# Patient Record
Sex: Female | Born: 1940 | Race: White | Hispanic: No | Marital: Married | Smoking: Never smoker
Health system: Southern US, Community
[De-identification: ages and names within clinical notes are randomized; demographics above are authoritative.]

---

## 2006-08-12 ENCOUNTER — Ambulatory Visit: Payer: Self-pay | Admitting: Internal Medicine

## 2006-08-12 LAB — CONVERTED CEMR LAB
BUN: 11 mg/dL (ref 6–23)
Basophils Absolute: 0 10*3/uL (ref 0.0–0.1)
Chloride: 102 meq/L (ref 96–112)
Creatinine, Ser: 0.8 mg/dL (ref 0.4–1.2)
Eosinophils Absolute: 0.2 10*3/uL (ref 0.0–0.6)
GFR calc non Af Amer: 76 mL/min
MCHC: 34.7 g/dL (ref 30.0–36.0)
Monocytes Absolute: 0.7 10*3/uL (ref 0.2–0.7)
Monocytes Relative: 13 % — ABNORMAL HIGH (ref 3.0–11.0)
Potassium: 4.4 meq/L (ref 3.5–5.1)
Pro B Natriuretic peptide (BNP): 58 pg/mL (ref 0.0–100.0)
RBC: 3.76 M/uL — ABNORMAL LOW (ref 3.87–5.11)
RDW: 14.1 % (ref 11.5–14.6)
Sed Rate: 25 mm/hr (ref 0–25)

## 2006-08-21 ENCOUNTER — Ambulatory Visit: Payer: Self-pay | Admitting: Cardiology

## 2006-09-02 ENCOUNTER — Ambulatory Visit: Payer: Self-pay

## 2010-07-10 NOTE — Assessment & Plan Note (Signed)
[facility]                             PULMONARY OFFICE NOTE   NAME:Cynthia Benson, Cynthia Benson                          MRN:          [id number]  DATE:08/12/2006                            DOB:          1940-08-03    HISTORY:  A 70 year old white female with 2 episodes of being diagnosed  with pneumonia, both starting first with cough to the point of vomiting,  associated with bilateral infiltrates one in April and the other time in  May.  She required 2 separate hospitalizations and has been left with  persistent dyspnea with exertion and 2 pillow orthopnea, but  interestingly no longer has any cough and denies any difficulty  swallowing.  She is seen now at Dr. Donita Brooks request because of an  abnormal chest CT scan which was striking mostly for the abnormality of  the esophagus (see below) and already has followup per GI arranged.   The patient denies ever having purulent sputum or fever during her 2  episodes of clinical pneumonia.  She remembers vomiting before each one.  She denies any dysphagia now, ongoing fevers and chills, and denies any  pleuritic pain at any time or exertional chest pain.   PAST MEDICAL HISTORY:  Presently she is too short of breath to do all of  her housework, whereas, prior to these illnesses she could do pretty  much anything she wanted.  1. Heart failure and hypertension. (I could not find a reference to      an echocardiogram in our records however)  2. Remote thyroid surgery.  3. Back surgery.  4. Stomach surgery, she is status post hysterectomy and      cholecystectomy.   ALLERGIES:  NONE KNOWN.   MEDICATIONS:  No pulmonary medicines but she is on both Actonel and  Tums.   For full inventory, please see face sheet column dated August 12, 2006.  Her medications were confusing to me.  I think she is still on  lisinopril, since it is listed on her discharge summary although she did  not list it today.  She also takes Nexium  but typically after lunch.   SOCIAL HISTORY:  She has never smoked, she is retired with no unusual  travel, pet, or hobby exposure.   FAMILY HISTORY:  Positive for asthma in her mother, cancer of the breast  in her sister.   REVIEW OF SYSTEMS:  Taken in detail on the worksheet and negative except  as outlined above.   PHYSICAL EXAMINATION:  This is a pleasant, ambulatory white female in no  acute distress. She is afebrile, normal vital signs.  She is quite  hoarse, In fact she becomes more hoarse the more she talks.  HEENT:  Unremarkable, oropharynx clear.  NECK:  Supple without cervical adenopathy, tenderness, trachea is  midline, no thyromegaly.  LUNGS:  Fields are completely clear bilaterally to auscultation and  percussion.  Regular rhythm without murmur, gallop, rub.  ABDOMEN:  Soft, benign.  EXTREMITIES:  Warm without calf tenderness, cyanosis, clubbing, edema.   Chest x-ray is pending.   CT scan  of the chest was reviewed from Jul 23, 2006 and indicates a  diffusely dilated, fluid-filled esophagus with prominent hiatal hernia  and patchy infiltrates primarily in the right lung with minimal  adenopathy.  Swallowing study was performed on July 27, 2006 indicating  motility within normal limits and no significant abnormality.  There was  mild prominence of the cricopharyngeal muscle.   No mention of reflux.   We ambulated the patient around the hall for more than 300 feet (she  previously told me she could only walk across the room and she had  shortness of breath after the second lap which was at 320 feet.  Was  saturating consistently above 88 until the very lest few feet which she  said is way beyond what I usually do.   IMPRESSION:  This patient has had 2 episodes of clinical pulmonary  infiltrates associated with vomiting in the setting of having abnormal  esophagus noted on CT scan, but appears to function normally on barium  swallow.  This leads me believe she has  a functional rather than  structural upper airway problem that is probably related to the  combination of low grade reflux and angiotensin converting enzyme  inhibitors.  I have found the combination of these 2 markedly  destabilized the upper airway, to the point where patients frequently  tell me that they coughed to the point where they gag and vomit.  Although this association is not clear from the literature, it has been  my personal experience that the 2 can be very destabilizing.   The first issue therefore is to maximize treatment directed at the upper  airway and minimize any medication that might contribute to the problem.  For that reason I recommended she take no more angiotensin converting  enzyme inhibitor and if after, a reduction is needed, I would endorse  ARB over calcium channel blockers because they could potentially  interfere with esophageal function.  I will also stop Actonel and  calcium in the short run and then add them back if we can establish a  firm baseline of improved dyspnea, elimination of current cough, and  gagging and vomiting.   I did recommend the combination of a sed rate and BNP today.  I have  found these to be very helpful although neither one is specific, they  both tend toward or away from primary lung disease and toward component  of congestive heart failure in patients with pulmonary infiltrative  disorder.   The patient already an appointment to see Dr. Daleen Squibb and her  gastrointestinal physician.  If her infiltrates remain despite optimal  management of her cardiac issues and gastrointestinal issues I would be  certainly happy to see her back here on a p.r.n. basis.   PLAN:  Would add back Actonel and Calcium tablets once we have  established optimal control of reflux and elimination of both dyspnea  and cough, hopefully back to her normal state by the above  recommendations.  Reviewed with her a gastroesophageal reflux disease diet  including the importance of taking Nexium 30-60 minutes before  breakfast time on a perfectly regular basis until we have a chance to  establish whether a reflux, even a small amount, has anything to do with  her pulmonary complaints.     Cynthia Dalton. Sherene Sires, MD, [facility]  Electronically Signed    MBW/MedQ  DD: 08/12/2006  DT: 08/13/2006  Job #: [id number]   cc:   Philemon Kingdom

## 2010-07-10 NOTE — Assessment & Plan Note (Signed)
HEALTHCARE                            CARDIOLOGY OFFICE NOTE   NAME:Cynthia Benson                        MRN:          [id number]  DATE:08/21/2006                            DOB:          April 28, 1940    Cynthia Benson is a 70 year old white female who comes for  evaluation of attenuate heart failure.   She has been admitted to [facility] twice over the past  several months. Both times she has had aspiration pneumonia, which in  looking through the records, looks like it was complicated with near-  respiratory failure, loss of consciousness or ability to get up after a  fall, rapid myolysis, and a question of congestive heart failure. During  her last hospitalization, her BNP was 1900.   Much of the etiology for recurrent aspiration pneumonia seems to be a  patulous esophagus with a very prominent hiatal hernia. CT scan has  showed a diffusely dilated esophagus with fluid. She saw Dr. Sandrea Hughs of our pulmonary division last week for this dilemma.   Her CT scan also showed no pulmonary emboli at the time.   Apparently, she had an echocardiogram during her stay, but I have no  report of such. I cannot find it in the discharge summary as well.   Her CPK enzymes were very high peaking at 5754 with an NB of 35.1. This  was felt not to be consistent with a cardiac source. Her troponin was  0.10.   She was treated with antibiotics and recovered. She was discharged home  on 07/29/06 for the second hospitalization.   During her hospitalization, she apparently had a tachycardia and there  was a question of a supraventricular tachycardia listed in the discharge  diagnosis. I have no telemetry strips. She was placed on Digoxin, as  well as Coreg. Dr. Sherene Sires stopped her Digoxin last week.   PAST MEDICAL HISTORY:  She has chronic peptic ulcer disease. She has had  a recent GI bleed in the past with anemia. She has a history of  hypothyroidism, chronic back pain, and a chronic pain syndrome. She has  a history of osteoarthritis, restless legs syndrome, and depression.  There is a labeling of peripheral vascular disease, but I have no  specific details.   PAST SURGICAL HISTORY:  She has had a sub-total gastrectomy from chronic  bleeding ulcers. She has had a cholecystectomy, appendectomy,  hysterectomy, and a bilateral salpingo oophorectomy. She has had lumbar  laminectomies x2, left knee reconstruction with vein stripping and  bladder tacking, as well as thyroidectomy secondary to Graves disease.   ALLERGIES:  No known drug allergies.   CURRENT MEDICATIONS:  1. Furosemide 20 mg daily.  2. Nexium 40 mg once daily.  3. Levothyroxine 100 mcg daily.  4. Temazepam 30 mg p.r.n.  5. Ferralet 90 mg daily.  6. Carvedilol 3.125 mg b.i.d.  7. Folic acid 1 mg daily.  8. Requip 2 mg daily.  9. Avinza 60 mg daily.  10.Endocet 10/325 mg p.r.n.  11.Lyrica 75 mg b.i.d.   She sleeps with two bricks  under her bed and also with two pillows.   SOCIAL HISTORY:  She is a non-smoker. She does not drink. She is exposed  to a lot of secondary smoke from her husband. She lives at home with her  husband in [place].   FAMILY HISTORY:  Remarkable for hypertension, diabetes, and a cerebral  vascular accident, but no premature coronary disease.   REVIEW OF SYSTEMS:  She denies any true orthopnea, PND, or peripheral  edema. She has had no PND.   The rest of her review of systems are in the HPI, otherwise negative.   PHYSICAL EXAMINATION:  GENERAL:  She is very pleasant.  VITAL SIGNS:  Blood pressure 105/57, pulse 58 and regular. Her  electrocardiogram shows sinus brady with poor R-wave progression across  the anterior precordium with possible septal infarct. She weighs 131  pounds. She is 5 foot 7.5 inches.  HEENT:  Normocephalic atraumatic. PERRLA, extraocular movements intact,  sclerae slightly injected. Facial symmetry is  normal.  NECK:  Supple. Carotid upstrokes are equal bilaterally without bruits.  There is no JVD. Thyroid is not enlarged. Trachea is midline.  LUNGS:  Reveal some crackles in both bases, but no rales. There is no  rhonchi.  HEART:  Reveals a non-displaced PMI. She has a normal S1, S2 with no  murmurs, rubs, or gallops.  ABDOMEN:  Soft with good bowel sounds. She has tenderness over the sub-  xyphoid process from a echocardiogram at [facility]. She says that they  pressed real hard. There is no hepatomegaly. There is no tenderness.  There is no midline bruit.  EXTREMITIES:  Reveal no cyanosis, clubbing, or edema. Pulses are  present.  NEUROLOGIC:  Intact.  SKIN:  Shows a few ecchymoses.   ASSESSMENT:  1. History of aspiration pneumonia, status post 2 hospitalizations in      the past several months. This was felt to be due to a dilated      esophagus with achalasia and a large hiatal hernia. She is doing      appropriate measures at present to lessen this problem. She saw Dr.      Sherene Sires about this last week.  2. Elevated BNP of unknown significance. I am not sure if she has      clinical heart failure.  3. Other problems as listed above.   PLAN:  1. Obtain 2D echo report from [facility].  2. With her dyspnea on exertion and cardiac risk factors, we will      perform an adenosine Myoview. If this is negative and her echo      shows good LV function, we will see her back on a p.r.n. basis.     Thomas C. Daleen Squibb, MD, [facility]     TCW/MedQ  DD: 08/21/2006  DT: 08/22/2006  Job #: [id number]   cc:   Philemon Kingdom

## 2016-03-18 DIAGNOSIS — N390 Urinary tract infection, site not specified: Secondary | ICD-10-CM | POA: Insufficient documentation

## 2016-03-19 DIAGNOSIS — E890 Postprocedural hypothyroidism: Secondary | ICD-10-CM | POA: Insufficient documentation

## 2016-03-19 DIAGNOSIS — G8929 Other chronic pain: Secondary | ICD-10-CM | POA: Insufficient documentation

## 2016-05-07 DIAGNOSIS — J189 Pneumonia, unspecified organism: Secondary | ICD-10-CM | POA: Diagnosis not present

## 2016-05-07 DIAGNOSIS — A419 Sepsis, unspecified organism: Secondary | ICD-10-CM

## 2016-05-07 DIAGNOSIS — L0231 Cutaneous abscess of buttock: Secondary | ICD-10-CM

## 2016-05-08 DIAGNOSIS — L0501 Pilonidal cyst with abscess: Secondary | ICD-10-CM | POA: Diagnosis not present

## 2016-05-08 DIAGNOSIS — J189 Pneumonia, unspecified organism: Secondary | ICD-10-CM | POA: Diagnosis not present

## 2016-05-08 DIAGNOSIS — D696 Thrombocytopenia, unspecified: Secondary | ICD-10-CM | POA: Diagnosis not present

## 2016-05-08 DIAGNOSIS — L0231 Cutaneous abscess of buttock: Secondary | ICD-10-CM | POA: Diagnosis not present

## 2016-05-08 DIAGNOSIS — A419 Sepsis, unspecified organism: Secondary | ICD-10-CM | POA: Diagnosis not present

## 2016-05-09 DIAGNOSIS — L0231 Cutaneous abscess of buttock: Secondary | ICD-10-CM | POA: Diagnosis not present

## 2016-05-09 DIAGNOSIS — A419 Sepsis, unspecified organism: Secondary | ICD-10-CM | POA: Diagnosis not present

## 2016-05-09 DIAGNOSIS — J189 Pneumonia, unspecified organism: Secondary | ICD-10-CM | POA: Diagnosis not present

## 2016-05-10 DIAGNOSIS — J189 Pneumonia, unspecified organism: Secondary | ICD-10-CM | POA: Diagnosis not present

## 2016-05-10 DIAGNOSIS — D696 Thrombocytopenia, unspecified: Secondary | ICD-10-CM

## 2016-05-10 DIAGNOSIS — A419 Sepsis, unspecified organism: Secondary | ICD-10-CM | POA: Diagnosis not present

## 2016-05-10 DIAGNOSIS — L0231 Cutaneous abscess of buttock: Secondary | ICD-10-CM | POA: Diagnosis not present

## 2016-05-10 DIAGNOSIS — L0501 Pilonidal cyst with abscess: Secondary | ICD-10-CM | POA: Diagnosis not present

## 2016-05-11 DIAGNOSIS — L0231 Cutaneous abscess of buttock: Secondary | ICD-10-CM | POA: Diagnosis not present

## 2016-05-11 DIAGNOSIS — D696 Thrombocytopenia, unspecified: Secondary | ICD-10-CM | POA: Diagnosis not present

## 2016-05-11 DIAGNOSIS — L0501 Pilonidal cyst with abscess: Secondary | ICD-10-CM | POA: Diagnosis not present

## 2016-05-20 DIAGNOSIS — Z09 Encounter for follow-up examination after completed treatment for conditions other than malignant neoplasm: Secondary | ICD-10-CM | POA: Insufficient documentation

## 2016-07-01 DIAGNOSIS — S31000S Unspecified open wound of lower back and pelvis without penetration into retroperitoneum, sequela: Secondary | ICD-10-CM | POA: Insufficient documentation

## 2016-12-13 DIAGNOSIS — D72819 Decreased white blood cell count, unspecified: Secondary | ICD-10-CM | POA: Insufficient documentation

## 2016-12-30 ENCOUNTER — Ambulatory Visit: Payer: Medicare Other | Admitting: Podiatry

## 2017-01-14 ENCOUNTER — Ambulatory Visit: Payer: Medicare Other | Admitting: Podiatry

## 2017-05-27 DIAGNOSIS — L0591 Pilonidal cyst without abscess: Secondary | ICD-10-CM | POA: Insufficient documentation

## 2017-07-15 DIAGNOSIS — A419 Sepsis, unspecified organism: Secondary | ICD-10-CM | POA: Diagnosis not present

## 2017-07-15 DIAGNOSIS — J690 Pneumonitis due to inhalation of food and vomit: Secondary | ICD-10-CM | POA: Diagnosis not present

## 2017-07-16 DIAGNOSIS — J690 Pneumonitis due to inhalation of food and vomit: Secondary | ICD-10-CM | POA: Diagnosis not present

## 2017-07-16 DIAGNOSIS — A419 Sepsis, unspecified organism: Secondary | ICD-10-CM | POA: Diagnosis not present

## 2017-07-17 DIAGNOSIS — J690 Pneumonitis due to inhalation of food and vomit: Secondary | ICD-10-CM | POA: Diagnosis not present

## 2017-07-17 DIAGNOSIS — A419 Sepsis, unspecified organism: Secondary | ICD-10-CM | POA: Diagnosis not present

## 2017-07-18 DIAGNOSIS — A419 Sepsis, unspecified organism: Secondary | ICD-10-CM | POA: Diagnosis not present

## 2017-07-18 DIAGNOSIS — J690 Pneumonitis due to inhalation of food and vomit: Secondary | ICD-10-CM | POA: Diagnosis not present

## 2017-08-30 DIAGNOSIS — J9620 Acute and chronic respiratory failure, unspecified whether with hypoxia or hypercapnia: Secondary | ICD-10-CM | POA: Diagnosis not present

## 2017-08-30 DIAGNOSIS — L89159 Pressure ulcer of sacral region, unspecified stage: Secondary | ICD-10-CM

## 2017-08-30 DIAGNOSIS — J181 Lobar pneumonia, unspecified organism: Secondary | ICD-10-CM

## 2017-08-30 DIAGNOSIS — A419 Sepsis, unspecified organism: Secondary | ICD-10-CM | POA: Diagnosis not present

## 2017-08-30 DIAGNOSIS — J189 Pneumonia, unspecified organism: Secondary | ICD-10-CM

## 2017-08-31 DIAGNOSIS — J189 Pneumonia, unspecified organism: Secondary | ICD-10-CM | POA: Diagnosis not present

## 2017-08-31 DIAGNOSIS — J9620 Acute and chronic respiratory failure, unspecified whether with hypoxia or hypercapnia: Secondary | ICD-10-CM | POA: Diagnosis not present

## 2017-08-31 DIAGNOSIS — J181 Lobar pneumonia, unspecified organism: Secondary | ICD-10-CM | POA: Diagnosis not present

## 2017-08-31 DIAGNOSIS — A419 Sepsis, unspecified organism: Secondary | ICD-10-CM | POA: Diagnosis not present

## 2017-09-01 DIAGNOSIS — J181 Lobar pneumonia, unspecified organism: Secondary | ICD-10-CM | POA: Diagnosis not present

## 2017-09-01 DIAGNOSIS — J9620 Acute and chronic respiratory failure, unspecified whether with hypoxia or hypercapnia: Secondary | ICD-10-CM | POA: Diagnosis not present

## 2017-09-01 DIAGNOSIS — A419 Sepsis, unspecified organism: Secondary | ICD-10-CM | POA: Diagnosis not present

## 2017-09-01 DIAGNOSIS — J189 Pneumonia, unspecified organism: Secondary | ICD-10-CM | POA: Diagnosis not present

## 2017-09-24 DIAGNOSIS — J9691 Respiratory failure, unspecified with hypoxia: Secondary | ICD-10-CM

## 2017-09-24 DIAGNOSIS — R945 Abnormal results of liver function studies: Secondary | ICD-10-CM | POA: Diagnosis not present

## 2017-09-24 DIAGNOSIS — R0902 Hypoxemia: Secondary | ICD-10-CM

## 2017-09-24 DIAGNOSIS — J9620 Acute and chronic respiratory failure, unspecified whether with hypoxia or hypercapnia: Secondary | ICD-10-CM

## 2017-09-24 DIAGNOSIS — J690 Pneumonitis due to inhalation of food and vomit: Secondary | ICD-10-CM

## 2017-09-24 DIAGNOSIS — E039 Hypothyroidism, unspecified: Secondary | ICD-10-CM

## 2017-09-24 DIAGNOSIS — E876 Hypokalemia: Secondary | ICD-10-CM | POA: Diagnosis not present

## 2017-09-24 DIAGNOSIS — A419 Sepsis, unspecified organism: Secondary | ICD-10-CM

## 2017-09-24 DIAGNOSIS — J449 Chronic obstructive pulmonary disease, unspecified: Secondary | ICD-10-CM

## 2017-09-24 DIAGNOSIS — I10 Essential (primary) hypertension: Secondary | ICD-10-CM

## 2017-09-24 DIAGNOSIS — S31000A Unspecified open wound of lower back and pelvis without penetration into retroperitoneum, initial encounter: Secondary | ICD-10-CM

## 2017-09-24 DIAGNOSIS — R112 Nausea with vomiting, unspecified: Secondary | ICD-10-CM | POA: Diagnosis not present

## 2017-09-24 DIAGNOSIS — D649 Anemia, unspecified: Secondary | ICD-10-CM

## 2017-09-24 DIAGNOSIS — R042 Hemoptysis: Secondary | ICD-10-CM

## 2017-09-24 DIAGNOSIS — K5900 Constipation, unspecified: Secondary | ICD-10-CM | POA: Diagnosis not present

## 2017-09-24 DIAGNOSIS — J189 Pneumonia, unspecified organism: Secondary | ICD-10-CM | POA: Diagnosis not present

## 2017-09-24 DIAGNOSIS — N289 Disorder of kidney and ureter, unspecified: Secondary | ICD-10-CM

## 2017-09-24 DIAGNOSIS — R509 Fever, unspecified: Secondary | ICD-10-CM

## 2017-09-24 DIAGNOSIS — F418 Other specified anxiety disorders: Secondary | ICD-10-CM

## 2017-09-25 DIAGNOSIS — J189 Pneumonia, unspecified organism: Secondary | ICD-10-CM | POA: Diagnosis not present

## 2017-09-25 DIAGNOSIS — K5900 Constipation, unspecified: Secondary | ICD-10-CM | POA: Diagnosis not present

## 2017-09-25 DIAGNOSIS — R945 Abnormal results of liver function studies: Secondary | ICD-10-CM | POA: Diagnosis not present

## 2017-09-25 DIAGNOSIS — E876 Hypokalemia: Secondary | ICD-10-CM | POA: Diagnosis not present

## 2017-09-26 DIAGNOSIS — E876 Hypokalemia: Secondary | ICD-10-CM | POA: Diagnosis not present

## 2017-09-26 DIAGNOSIS — K5900 Constipation, unspecified: Secondary | ICD-10-CM | POA: Diagnosis not present

## 2017-09-26 DIAGNOSIS — R945 Abnormal results of liver function studies: Secondary | ICD-10-CM | POA: Diagnosis not present

## 2017-09-26 DIAGNOSIS — J189 Pneumonia, unspecified organism: Secondary | ICD-10-CM | POA: Diagnosis not present

## 2017-09-27 DIAGNOSIS — J189 Pneumonia, unspecified organism: Secondary | ICD-10-CM | POA: Diagnosis not present

## 2017-09-27 DIAGNOSIS — R945 Abnormal results of liver function studies: Secondary | ICD-10-CM | POA: Diagnosis not present

## 2017-09-27 DIAGNOSIS — E876 Hypokalemia: Secondary | ICD-10-CM | POA: Diagnosis not present

## 2017-09-27 DIAGNOSIS — K5900 Constipation, unspecified: Secondary | ICD-10-CM | POA: Diagnosis not present

## 2017-09-28 DIAGNOSIS — J189 Pneumonia, unspecified organism: Secondary | ICD-10-CM | POA: Diagnosis not present

## 2017-09-28 DIAGNOSIS — R945 Abnormal results of liver function studies: Secondary | ICD-10-CM | POA: Diagnosis not present

## 2017-09-28 DIAGNOSIS — E876 Hypokalemia: Secondary | ICD-10-CM | POA: Diagnosis not present

## 2017-09-28 DIAGNOSIS — K5900 Constipation, unspecified: Secondary | ICD-10-CM | POA: Diagnosis not present

## 2017-12-18 DIAGNOSIS — J189 Pneumonia, unspecified organism: Secondary | ICD-10-CM

## 2017-12-18 DIAGNOSIS — R4182 Altered mental status, unspecified: Secondary | ICD-10-CM | POA: Diagnosis not present

## 2017-12-18 DIAGNOSIS — E039 Hypothyroidism, unspecified: Secondary | ICD-10-CM

## 2017-12-18 DIAGNOSIS — D539 Nutritional anemia, unspecified: Secondary | ICD-10-CM | POA: Diagnosis not present

## 2017-12-18 DIAGNOSIS — J449 Chronic obstructive pulmonary disease, unspecified: Secondary | ICD-10-CM

## 2017-12-18 DIAGNOSIS — J9620 Acute and chronic respiratory failure, unspecified whether with hypoxia or hypercapnia: Secondary | ICD-10-CM | POA: Diagnosis not present

## 2017-12-18 DIAGNOSIS — I10 Essential (primary) hypertension: Secondary | ICD-10-CM

## 2017-12-18 DIAGNOSIS — J690 Pneumonitis due to inhalation of food and vomit: Secondary | ICD-10-CM | POA: Diagnosis not present

## 2017-12-19 DIAGNOSIS — J9620 Acute and chronic respiratory failure, unspecified whether with hypoxia or hypercapnia: Secondary | ICD-10-CM | POA: Diagnosis not present

## 2017-12-19 DIAGNOSIS — R4182 Altered mental status, unspecified: Secondary | ICD-10-CM | POA: Diagnosis not present

## 2017-12-19 DIAGNOSIS — J690 Pneumonitis due to inhalation of food and vomit: Secondary | ICD-10-CM | POA: Diagnosis not present

## 2018-02-20 DIAGNOSIS — D539 Nutritional anemia, unspecified: Secondary | ICD-10-CM

## 2018-02-20 DIAGNOSIS — I10 Essential (primary) hypertension: Secondary | ICD-10-CM

## 2018-02-20 DIAGNOSIS — S31000A Unspecified open wound of lower back and pelvis without penetration into retroperitoneum, initial encounter: Secondary | ICD-10-CM

## 2018-02-20 DIAGNOSIS — R4182 Altered mental status, unspecified: Secondary | ICD-10-CM | POA: Diagnosis not present

## 2018-02-20 DIAGNOSIS — J449 Chronic obstructive pulmonary disease, unspecified: Secondary | ICD-10-CM

## 2018-02-20 DIAGNOSIS — M549 Dorsalgia, unspecified: Secondary | ICD-10-CM | POA: Diagnosis not present

## 2018-02-20 DIAGNOSIS — E875 Hyperkalemia: Secondary | ICD-10-CM | POA: Diagnosis not present

## 2018-02-20 DIAGNOSIS — R531 Weakness: Secondary | ICD-10-CM | POA: Diagnosis not present

## 2018-02-20 DIAGNOSIS — R112 Nausea with vomiting, unspecified: Secondary | ICD-10-CM

## 2018-02-21 DIAGNOSIS — R4182 Altered mental status, unspecified: Secondary | ICD-10-CM | POA: Diagnosis not present

## 2018-02-21 DIAGNOSIS — E875 Hyperkalemia: Secondary | ICD-10-CM | POA: Diagnosis not present

## 2018-02-21 DIAGNOSIS — M549 Dorsalgia, unspecified: Secondary | ICD-10-CM | POA: Diagnosis not present

## 2018-02-21 DIAGNOSIS — R531 Weakness: Secondary | ICD-10-CM | POA: Diagnosis not present

## 2018-02-24 DIAGNOSIS — E039 Hypothyroidism, unspecified: Secondary | ICD-10-CM

## 2018-02-24 DIAGNOSIS — L89159 Pressure ulcer of sacral region, unspecified stage: Secondary | ICD-10-CM

## 2018-02-24 DIAGNOSIS — K219 Gastro-esophageal reflux disease without esophagitis: Secondary | ICD-10-CM

## 2018-02-24 DIAGNOSIS — N179 Acute kidney failure, unspecified: Secondary | ICD-10-CM | POA: Diagnosis not present

## 2018-02-24 DIAGNOSIS — J690 Pneumonitis due to inhalation of food and vomit: Secondary | ICD-10-CM | POA: Diagnosis not present

## 2018-02-24 DIAGNOSIS — G8929 Other chronic pain: Secondary | ICD-10-CM

## 2018-02-24 DIAGNOSIS — J189 Pneumonia, unspecified organism: Secondary | ICD-10-CM | POA: Diagnosis not present

## 2018-02-24 DIAGNOSIS — J440 Chronic obstructive pulmonary disease with acute lower respiratory infection: Secondary | ICD-10-CM | POA: Diagnosis not present

## 2018-02-24 DIAGNOSIS — I10 Essential (primary) hypertension: Secondary | ICD-10-CM

## 2018-02-24 DIAGNOSIS — M1990 Unspecified osteoarthritis, unspecified site: Secondary | ICD-10-CM

## 2018-02-25 DIAGNOSIS — N179 Acute kidney failure, unspecified: Secondary | ICD-10-CM | POA: Diagnosis not present

## 2018-02-25 DIAGNOSIS — J440 Chronic obstructive pulmonary disease with acute lower respiratory infection: Secondary | ICD-10-CM | POA: Diagnosis not present

## 2018-02-25 DIAGNOSIS — J690 Pneumonitis due to inhalation of food and vomit: Secondary | ICD-10-CM | POA: Diagnosis not present

## 2018-02-25 DIAGNOSIS — J189 Pneumonia, unspecified organism: Secondary | ICD-10-CM | POA: Diagnosis not present

## 2018-02-26 DIAGNOSIS — J440 Chronic obstructive pulmonary disease with acute lower respiratory infection: Secondary | ICD-10-CM | POA: Diagnosis not present

## 2018-02-26 DIAGNOSIS — N179 Acute kidney failure, unspecified: Secondary | ICD-10-CM | POA: Diagnosis not present

## 2018-02-26 DIAGNOSIS — J690 Pneumonitis due to inhalation of food and vomit: Secondary | ICD-10-CM | POA: Diagnosis not present

## 2018-02-26 DIAGNOSIS — J189 Pneumonia, unspecified organism: Secondary | ICD-10-CM | POA: Diagnosis not present

## 2018-02-27 DIAGNOSIS — N179 Acute kidney failure, unspecified: Secondary | ICD-10-CM | POA: Diagnosis not present

## 2018-02-27 DIAGNOSIS — J189 Pneumonia, unspecified organism: Secondary | ICD-10-CM | POA: Diagnosis not present

## 2018-02-27 DIAGNOSIS — J440 Chronic obstructive pulmonary disease with acute lower respiratory infection: Secondary | ICD-10-CM | POA: Diagnosis not present

## 2018-02-27 DIAGNOSIS — J690 Pneumonitis due to inhalation of food and vomit: Secondary | ICD-10-CM | POA: Diagnosis not present

## 2018-03-28 DIAGNOSIS — R9431 Abnormal electrocardiogram [ECG] [EKG]: Secondary | ICD-10-CM | POA: Insufficient documentation

## 2018-03-28 DIAGNOSIS — J189 Pneumonia, unspecified organism: Secondary | ICD-10-CM | POA: Insufficient documentation

## 2018-03-28 DIAGNOSIS — E876 Hypokalemia: Secondary | ICD-10-CM | POA: Insufficient documentation

## 2018-03-28 DIAGNOSIS — R112 Nausea with vomiting, unspecified: Secondary | ICD-10-CM | POA: Insufficient documentation

## 2018-03-28 DIAGNOSIS — N179 Acute kidney failure, unspecified: Secondary | ICD-10-CM | POA: Insufficient documentation

## 2018-03-28 DIAGNOSIS — J9601 Acute respiratory failure with hypoxia: Secondary | ICD-10-CM | POA: Insufficient documentation

## 2018-05-01 DIAGNOSIS — K5901 Slow transit constipation: Secondary | ICD-10-CM | POA: Insufficient documentation

## 2018-05-01 DIAGNOSIS — R590 Localized enlarged lymph nodes: Secondary | ICD-10-CM | POA: Insufficient documentation

## 2018-05-01 DIAGNOSIS — L89109 Pressure ulcer of unspecified part of back, unspecified stage: Secondary | ICD-10-CM | POA: Insufficient documentation

## 2018-05-02 DIAGNOSIS — Z79899 Other long term (current) drug therapy: Secondary | ICD-10-CM | POA: Insufficient documentation

## 2018-10-28 DIAGNOSIS — M869 Osteomyelitis, unspecified: Secondary | ICD-10-CM | POA: Insufficient documentation

## 2018-12-26 DIAGNOSIS — G4734 Idiopathic sleep related nonobstructive alveolar hypoventilation: Secondary | ICD-10-CM | POA: Insufficient documentation

## 2018-12-27 DIAGNOSIS — J441 Chronic obstructive pulmonary disease with (acute) exacerbation: Secondary | ICD-10-CM | POA: Insufficient documentation

## 2018-12-28 DIAGNOSIS — D649 Anemia, unspecified: Secondary | ICD-10-CM | POA: Insufficient documentation

## 2019-01-15 DIAGNOSIS — Z8614 Personal history of Methicillin resistant Staphylococcus aureus infection: Secondary | ICD-10-CM | POA: Insufficient documentation

## 2019-05-26 DIAGNOSIS — J690 Pneumonitis due to inhalation of food and vomit: Secondary | ICD-10-CM | POA: Insufficient documentation

## 2019-05-26 DIAGNOSIS — F1120 Opioid dependence, uncomplicated: Secondary | ICD-10-CM | POA: Insufficient documentation

## 2019-05-27 DIAGNOSIS — Z9189 Other specified personal risk factors, not elsewhere classified: Secondary | ICD-10-CM | POA: Insufficient documentation

## 2019-05-28 DIAGNOSIS — I10 Essential (primary) hypertension: Secondary | ICD-10-CM | POA: Insufficient documentation

## 2019-05-28 DIAGNOSIS — R8271 Bacteriuria: Secondary | ICD-10-CM | POA: Insufficient documentation

## 2019-06-08 DIAGNOSIS — L97422 Non-pressure chronic ulcer of left heel and midfoot with fat layer exposed: Secondary | ICD-10-CM | POA: Insufficient documentation

## 2019-06-08 DIAGNOSIS — L97311 Non-pressure chronic ulcer of right ankle limited to breakdown of skin: Secondary | ICD-10-CM | POA: Insufficient documentation

## 2019-06-08 DIAGNOSIS — B351 Tinea unguium: Secondary | ICD-10-CM | POA: Insufficient documentation

## 2019-06-19 DIAGNOSIS — N390 Urinary tract infection, site not specified: Secondary | ICD-10-CM

## 2019-06-19 DIAGNOSIS — A419 Sepsis, unspecified organism: Secondary | ICD-10-CM

## 2019-06-19 DIAGNOSIS — R4182 Altered mental status, unspecified: Secondary | ICD-10-CM

## 2019-06-19 DIAGNOSIS — J189 Pneumonia, unspecified organism: Secondary | ICD-10-CM

## 2019-06-20 DIAGNOSIS — J189 Pneumonia, unspecified organism: Secondary | ICD-10-CM | POA: Diagnosis not present

## 2019-06-20 DIAGNOSIS — N390 Urinary tract infection, site not specified: Secondary | ICD-10-CM | POA: Diagnosis not present

## 2019-06-20 DIAGNOSIS — R4182 Altered mental status, unspecified: Secondary | ICD-10-CM | POA: Diagnosis not present

## 2019-06-20 DIAGNOSIS — A419 Sepsis, unspecified organism: Secondary | ICD-10-CM | POA: Diagnosis not present

## 2019-06-21 DIAGNOSIS — N390 Urinary tract infection, site not specified: Secondary | ICD-10-CM | POA: Diagnosis not present

## 2019-06-21 DIAGNOSIS — J189 Pneumonia, unspecified organism: Secondary | ICD-10-CM | POA: Diagnosis not present

## 2019-06-21 DIAGNOSIS — R4182 Altered mental status, unspecified: Secondary | ICD-10-CM | POA: Diagnosis not present

## 2019-06-21 DIAGNOSIS — A419 Sepsis, unspecified organism: Secondary | ICD-10-CM | POA: Diagnosis not present

## 2019-06-22 DIAGNOSIS — A419 Sepsis, unspecified organism: Secondary | ICD-10-CM | POA: Diagnosis not present

## 2019-06-22 DIAGNOSIS — R4182 Altered mental status, unspecified: Secondary | ICD-10-CM | POA: Diagnosis not present

## 2019-06-22 DIAGNOSIS — N390 Urinary tract infection, site not specified: Secondary | ICD-10-CM | POA: Diagnosis not present

## 2019-06-22 DIAGNOSIS — J189 Pneumonia, unspecified organism: Secondary | ICD-10-CM | POA: Diagnosis not present

## 2019-06-23 DIAGNOSIS — R4182 Altered mental status, unspecified: Secondary | ICD-10-CM | POA: Diagnosis not present

## 2019-06-23 DIAGNOSIS — A419 Sepsis, unspecified organism: Secondary | ICD-10-CM | POA: Diagnosis not present

## 2019-06-23 DIAGNOSIS — J189 Pneumonia, unspecified organism: Secondary | ICD-10-CM | POA: Diagnosis not present

## 2019-06-23 DIAGNOSIS — N390 Urinary tract infection, site not specified: Secondary | ICD-10-CM | POA: Diagnosis not present

## 2019-06-24 DIAGNOSIS — J189 Pneumonia, unspecified organism: Secondary | ICD-10-CM | POA: Diagnosis not present

## 2019-06-24 DIAGNOSIS — R4182 Altered mental status, unspecified: Secondary | ICD-10-CM | POA: Diagnosis not present

## 2019-06-24 DIAGNOSIS — A419 Sepsis, unspecified organism: Secondary | ICD-10-CM | POA: Diagnosis not present

## 2019-06-24 DIAGNOSIS — N390 Urinary tract infection, site not specified: Secondary | ICD-10-CM | POA: Diagnosis not present

## 2019-07-03 DIAGNOSIS — D539 Nutritional anemia, unspecified: Secondary | ICD-10-CM | POA: Insufficient documentation

## 2019-07-03 DIAGNOSIS — L98429 Non-pressure chronic ulcer of back with unspecified severity: Secondary | ICD-10-CM | POA: Insufficient documentation

## 2019-09-26 DIAGNOSIS — J9611 Chronic respiratory failure with hypoxia: Secondary | ICD-10-CM | POA: Diagnosis not present

## 2019-09-26 DIAGNOSIS — I2510 Atherosclerotic heart disease of native coronary artery without angina pectoris: Secondary | ICD-10-CM | POA: Diagnosis not present

## 2019-09-26 DIAGNOSIS — Z9981 Dependence on supplemental oxygen: Secondary | ICD-10-CM | POA: Diagnosis not present

## 2019-09-26 DIAGNOSIS — Z9181 History of falling: Secondary | ICD-10-CM | POA: Diagnosis not present

## 2019-09-26 DIAGNOSIS — E039 Hypothyroidism, unspecified: Secondary | ICD-10-CM | POA: Diagnosis not present

## 2019-09-26 DIAGNOSIS — M1990 Unspecified osteoarthritis, unspecified site: Secondary | ICD-10-CM | POA: Diagnosis not present

## 2019-09-26 DIAGNOSIS — L89152 Pressure ulcer of sacral region, stage 2: Secondary | ICD-10-CM | POA: Diagnosis not present

## 2019-09-26 DIAGNOSIS — D61818 Other pancytopenia: Secondary | ICD-10-CM | POA: Diagnosis not present

## 2019-09-26 DIAGNOSIS — J449 Chronic obstructive pulmonary disease, unspecified: Secondary | ICD-10-CM | POA: Diagnosis not present

## 2019-09-26 DIAGNOSIS — N1832 Chronic kidney disease, stage 3b: Secondary | ICD-10-CM | POA: Diagnosis not present

## 2019-09-26 DIAGNOSIS — E161 Other hypoglycemia: Secondary | ICD-10-CM | POA: Diagnosis not present

## 2019-09-26 DIAGNOSIS — I129 Hypertensive chronic kidney disease with stage 1 through stage 4 chronic kidney disease, or unspecified chronic kidney disease: Secondary | ICD-10-CM | POA: Diagnosis not present

## 2019-09-26 DIAGNOSIS — D631 Anemia in chronic kidney disease: Secondary | ICD-10-CM | POA: Diagnosis not present

## 2019-09-26 DIAGNOSIS — N390 Urinary tract infection, site not specified: Secondary | ICD-10-CM | POA: Diagnosis not present

## 2019-09-26 DIAGNOSIS — M549 Dorsalgia, unspecified: Secondary | ICD-10-CM | POA: Diagnosis not present

## 2019-09-26 DIAGNOSIS — L89512 Pressure ulcer of right ankle, stage 2: Secondary | ICD-10-CM | POA: Diagnosis not present

## 2019-09-26 DIAGNOSIS — K5900 Constipation, unspecified: Secondary | ICD-10-CM | POA: Diagnosis not present

## 2019-09-26 DIAGNOSIS — G94 Other disorders of brain in diseases classified elsewhere: Secondary | ICD-10-CM | POA: Diagnosis not present

## 2019-09-26 DIAGNOSIS — K222 Esophageal obstruction: Secondary | ICD-10-CM | POA: Diagnosis not present

## 2019-09-27 DIAGNOSIS — Z9981 Dependence on supplemental oxygen: Secondary | ICD-10-CM | POA: Diagnosis not present

## 2019-09-27 DIAGNOSIS — K222 Esophageal obstruction: Secondary | ICD-10-CM | POA: Diagnosis not present

## 2019-09-27 DIAGNOSIS — I2510 Atherosclerotic heart disease of native coronary artery without angina pectoris: Secondary | ICD-10-CM | POA: Diagnosis not present

## 2019-09-27 DIAGNOSIS — Z9181 History of falling: Secondary | ICD-10-CM | POA: Diagnosis not present

## 2019-09-27 DIAGNOSIS — K5900 Constipation, unspecified: Secondary | ICD-10-CM | POA: Diagnosis not present

## 2019-09-27 DIAGNOSIS — L89512 Pressure ulcer of right ankle, stage 2: Secondary | ICD-10-CM | POA: Diagnosis not present

## 2019-09-27 DIAGNOSIS — N1832 Chronic kidney disease, stage 3b: Secondary | ICD-10-CM | POA: Diagnosis not present

## 2019-09-27 DIAGNOSIS — N390 Urinary tract infection, site not specified: Secondary | ICD-10-CM | POA: Diagnosis not present

## 2019-09-27 DIAGNOSIS — E161 Other hypoglycemia: Secondary | ICD-10-CM | POA: Diagnosis not present

## 2019-09-27 DIAGNOSIS — D61818 Other pancytopenia: Secondary | ICD-10-CM | POA: Diagnosis not present

## 2019-09-27 DIAGNOSIS — G94 Other disorders of brain in diseases classified elsewhere: Secondary | ICD-10-CM | POA: Diagnosis not present

## 2019-09-27 DIAGNOSIS — I129 Hypertensive chronic kidney disease with stage 1 through stage 4 chronic kidney disease, or unspecified chronic kidney disease: Secondary | ICD-10-CM | POA: Diagnosis not present

## 2019-09-27 DIAGNOSIS — L89152 Pressure ulcer of sacral region, stage 2: Secondary | ICD-10-CM | POA: Diagnosis not present

## 2019-09-27 DIAGNOSIS — M549 Dorsalgia, unspecified: Secondary | ICD-10-CM | POA: Diagnosis not present

## 2019-09-27 DIAGNOSIS — J449 Chronic obstructive pulmonary disease, unspecified: Secondary | ICD-10-CM | POA: Diagnosis not present

## 2019-09-27 DIAGNOSIS — M1990 Unspecified osteoarthritis, unspecified site: Secondary | ICD-10-CM | POA: Diagnosis not present

## 2019-09-27 DIAGNOSIS — J9611 Chronic respiratory failure with hypoxia: Secondary | ICD-10-CM | POA: Diagnosis not present

## 2019-09-27 DIAGNOSIS — E039 Hypothyroidism, unspecified: Secondary | ICD-10-CM | POA: Diagnosis not present

## 2019-09-27 DIAGNOSIS — D631 Anemia in chronic kidney disease: Secondary | ICD-10-CM | POA: Diagnosis not present

## 2019-09-29 DIAGNOSIS — N390 Urinary tract infection, site not specified: Secondary | ICD-10-CM | POA: Diagnosis not present

## 2019-09-29 DIAGNOSIS — D61818 Other pancytopenia: Secondary | ICD-10-CM | POA: Diagnosis not present

## 2019-09-29 DIAGNOSIS — Z9181 History of falling: Secondary | ICD-10-CM | POA: Diagnosis not present

## 2019-09-29 DIAGNOSIS — J449 Chronic obstructive pulmonary disease, unspecified: Secondary | ICD-10-CM | POA: Diagnosis not present

## 2019-09-29 DIAGNOSIS — K222 Esophageal obstruction: Secondary | ICD-10-CM | POA: Diagnosis not present

## 2019-09-29 DIAGNOSIS — E039 Hypothyroidism, unspecified: Secondary | ICD-10-CM | POA: Diagnosis not present

## 2019-09-29 DIAGNOSIS — E161 Other hypoglycemia: Secondary | ICD-10-CM | POA: Diagnosis not present

## 2019-09-29 DIAGNOSIS — D631 Anemia in chronic kidney disease: Secondary | ICD-10-CM | POA: Diagnosis not present

## 2019-09-29 DIAGNOSIS — M549 Dorsalgia, unspecified: Secondary | ICD-10-CM | POA: Diagnosis not present

## 2019-09-29 DIAGNOSIS — M1990 Unspecified osteoarthritis, unspecified site: Secondary | ICD-10-CM | POA: Diagnosis not present

## 2019-09-29 DIAGNOSIS — L89512 Pressure ulcer of right ankle, stage 2: Secondary | ICD-10-CM | POA: Diagnosis not present

## 2019-09-29 DIAGNOSIS — L89152 Pressure ulcer of sacral region, stage 2: Secondary | ICD-10-CM | POA: Diagnosis not present

## 2019-09-29 DIAGNOSIS — N1832 Chronic kidney disease, stage 3b: Secondary | ICD-10-CM | POA: Diagnosis not present

## 2019-09-29 DIAGNOSIS — G94 Other disorders of brain in diseases classified elsewhere: Secondary | ICD-10-CM | POA: Diagnosis not present

## 2019-09-29 DIAGNOSIS — R609 Edema, unspecified: Secondary | ICD-10-CM | POA: Diagnosis not present

## 2019-09-29 DIAGNOSIS — K219 Gastro-esophageal reflux disease without esophagitis: Secondary | ICD-10-CM | POA: Diagnosis not present

## 2019-09-29 DIAGNOSIS — Z9981 Dependence on supplemental oxygen: Secondary | ICD-10-CM | POA: Diagnosis not present

## 2019-09-29 DIAGNOSIS — E162 Hypoglycemia, unspecified: Secondary | ICD-10-CM | POA: Diagnosis not present

## 2019-09-29 DIAGNOSIS — J9611 Chronic respiratory failure with hypoxia: Secondary | ICD-10-CM | POA: Diagnosis not present

## 2019-09-29 DIAGNOSIS — K5900 Constipation, unspecified: Secondary | ICD-10-CM | POA: Diagnosis not present

## 2019-09-29 DIAGNOSIS — I2510 Atherosclerotic heart disease of native coronary artery without angina pectoris: Secondary | ICD-10-CM | POA: Diagnosis not present

## 2019-09-29 DIAGNOSIS — I129 Hypertensive chronic kidney disease with stage 1 through stage 4 chronic kidney disease, or unspecified chronic kidney disease: Secondary | ICD-10-CM | POA: Diagnosis not present

## 2019-10-01 DIAGNOSIS — I2510 Atherosclerotic heart disease of native coronary artery without angina pectoris: Secondary | ICD-10-CM | POA: Diagnosis not present

## 2019-10-01 DIAGNOSIS — J449 Chronic obstructive pulmonary disease, unspecified: Secondary | ICD-10-CM | POA: Diagnosis not present

## 2019-10-01 DIAGNOSIS — Z9981 Dependence on supplemental oxygen: Secondary | ICD-10-CM | POA: Diagnosis not present

## 2019-10-01 DIAGNOSIS — N390 Urinary tract infection, site not specified: Secondary | ICD-10-CM | POA: Diagnosis not present

## 2019-10-01 DIAGNOSIS — E161 Other hypoglycemia: Secondary | ICD-10-CM | POA: Diagnosis not present

## 2019-10-01 DIAGNOSIS — N1832 Chronic kidney disease, stage 3b: Secondary | ICD-10-CM | POA: Diagnosis not present

## 2019-10-01 DIAGNOSIS — L89152 Pressure ulcer of sacral region, stage 2: Secondary | ICD-10-CM | POA: Diagnosis not present

## 2019-10-01 DIAGNOSIS — D631 Anemia in chronic kidney disease: Secondary | ICD-10-CM | POA: Diagnosis not present

## 2019-10-01 DIAGNOSIS — M549 Dorsalgia, unspecified: Secondary | ICD-10-CM | POA: Diagnosis not present

## 2019-10-01 DIAGNOSIS — D61818 Other pancytopenia: Secondary | ICD-10-CM | POA: Diagnosis not present

## 2019-10-01 DIAGNOSIS — I129 Hypertensive chronic kidney disease with stage 1 through stage 4 chronic kidney disease, or unspecified chronic kidney disease: Secondary | ICD-10-CM | POA: Diagnosis not present

## 2019-10-01 DIAGNOSIS — J9611 Chronic respiratory failure with hypoxia: Secondary | ICD-10-CM | POA: Diagnosis not present

## 2019-10-01 DIAGNOSIS — E039 Hypothyroidism, unspecified: Secondary | ICD-10-CM | POA: Diagnosis not present

## 2019-10-01 DIAGNOSIS — G94 Other disorders of brain in diseases classified elsewhere: Secondary | ICD-10-CM | POA: Diagnosis not present

## 2019-10-01 DIAGNOSIS — Z9181 History of falling: Secondary | ICD-10-CM | POA: Diagnosis not present

## 2019-10-01 DIAGNOSIS — M1990 Unspecified osteoarthritis, unspecified site: Secondary | ICD-10-CM | POA: Diagnosis not present

## 2019-10-01 DIAGNOSIS — L89512 Pressure ulcer of right ankle, stage 2: Secondary | ICD-10-CM | POA: Diagnosis not present

## 2019-10-01 DIAGNOSIS — K222 Esophageal obstruction: Secondary | ICD-10-CM | POA: Diagnosis not present

## 2019-10-01 DIAGNOSIS — K5900 Constipation, unspecified: Secondary | ICD-10-CM | POA: Diagnosis not present

## 2019-10-02 DIAGNOSIS — R1111 Vomiting without nausea: Secondary | ICD-10-CM | POA: Diagnosis not present

## 2019-10-02 DIAGNOSIS — R112 Nausea with vomiting, unspecified: Secondary | ICD-10-CM | POA: Diagnosis not present

## 2019-10-02 DIAGNOSIS — R52 Pain, unspecified: Secondary | ICD-10-CM | POA: Diagnosis not present

## 2019-10-02 DIAGNOSIS — R110 Nausea: Secondary | ICD-10-CM | POA: Diagnosis not present

## 2019-10-02 DIAGNOSIS — Z743 Need for continuous supervision: Secondary | ICD-10-CM | POA: Diagnosis not present

## 2019-10-03 DIAGNOSIS — R519 Headache, unspecified: Secondary | ICD-10-CM | POA: Diagnosis not present

## 2019-10-03 DIAGNOSIS — E872 Acidosis, unspecified: Secondary | ICD-10-CM | POA: Insufficient documentation

## 2019-10-03 DIAGNOSIS — Z0389 Encounter for observation for other suspected diseases and conditions ruled out: Secondary | ICD-10-CM | POA: Diagnosis not present

## 2019-10-03 DIAGNOSIS — L89152 Pressure ulcer of sacral region, stage 2: Secondary | ICD-10-CM | POA: Diagnosis not present

## 2019-10-03 DIAGNOSIS — I10 Essential (primary) hypertension: Secondary | ICD-10-CM | POA: Diagnosis not present

## 2019-10-03 DIAGNOSIS — M79606 Pain in leg, unspecified: Secondary | ICD-10-CM | POA: Diagnosis not present

## 2019-10-03 DIAGNOSIS — E890 Postprocedural hypothyroidism: Secondary | ICD-10-CM | POA: Diagnosis not present

## 2019-10-03 DIAGNOSIS — M549 Dorsalgia, unspecified: Secondary | ICD-10-CM | POA: Diagnosis not present

## 2019-10-03 DIAGNOSIS — Z20822 Contact with and (suspected) exposure to covid-19: Secondary | ICD-10-CM | POA: Diagnosis not present

## 2019-10-03 DIAGNOSIS — G8929 Other chronic pain: Secondary | ICD-10-CM | POA: Diagnosis not present

## 2019-10-03 DIAGNOSIS — S0990XA Unspecified injury of head, initial encounter: Secondary | ICD-10-CM | POA: Diagnosis not present

## 2019-10-03 DIAGNOSIS — I6782 Cerebral ischemia: Secondary | ICD-10-CM | POA: Diagnosis not present

## 2019-10-03 DIAGNOSIS — E860 Dehydration: Secondary | ICD-10-CM | POA: Diagnosis not present

## 2019-10-03 DIAGNOSIS — R9389 Abnormal findings on diagnostic imaging of other specified body structures: Secondary | ICD-10-CM | POA: Diagnosis not present

## 2019-10-03 DIAGNOSIS — Z79899 Other long term (current) drug therapy: Secondary | ICD-10-CM | POA: Diagnosis not present

## 2019-10-03 DIAGNOSIS — Z8614 Personal history of Methicillin resistant Staphylococcus aureus infection: Secondary | ICD-10-CM | POA: Diagnosis not present

## 2019-10-03 DIAGNOSIS — Z03818 Encounter for observation for suspected exposure to other biological agents ruled out: Secondary | ICD-10-CM | POA: Diagnosis not present

## 2019-10-04 DIAGNOSIS — N390 Urinary tract infection, site not specified: Secondary | ICD-10-CM | POA: Diagnosis not present

## 2019-10-04 DIAGNOSIS — M549 Dorsalgia, unspecified: Secondary | ICD-10-CM | POA: Diagnosis not present

## 2019-10-04 DIAGNOSIS — K219 Gastro-esophageal reflux disease without esophagitis: Secondary | ICD-10-CM | POA: Diagnosis not present

## 2019-10-04 DIAGNOSIS — K222 Esophageal obstruction: Secondary | ICD-10-CM | POA: Diagnosis not present

## 2019-10-04 DIAGNOSIS — D61818 Other pancytopenia: Secondary | ICD-10-CM | POA: Diagnosis not present

## 2019-10-04 DIAGNOSIS — I10 Essential (primary) hypertension: Secondary | ICD-10-CM | POA: Diagnosis not present

## 2019-10-04 DIAGNOSIS — L89152 Pressure ulcer of sacral region, stage 2: Secondary | ICD-10-CM | POA: Diagnosis not present

## 2019-10-04 DIAGNOSIS — D631 Anemia in chronic kidney disease: Secondary | ICD-10-CM | POA: Diagnosis not present

## 2019-10-04 DIAGNOSIS — J449 Chronic obstructive pulmonary disease, unspecified: Secondary | ICD-10-CM | POA: Diagnosis not present

## 2019-10-04 DIAGNOSIS — E162 Hypoglycemia, unspecified: Secondary | ICD-10-CM | POA: Diagnosis not present

## 2019-10-04 DIAGNOSIS — I129 Hypertensive chronic kidney disease with stage 1 through stage 4 chronic kidney disease, or unspecified chronic kidney disease: Secondary | ICD-10-CM | POA: Diagnosis not present

## 2019-10-04 DIAGNOSIS — Z9181 History of falling: Secondary | ICD-10-CM | POA: Diagnosis not present

## 2019-10-04 DIAGNOSIS — Z9981 Dependence on supplemental oxygen: Secondary | ICD-10-CM | POA: Diagnosis not present

## 2019-10-04 DIAGNOSIS — M1990 Unspecified osteoarthritis, unspecified site: Secondary | ICD-10-CM | POA: Diagnosis not present

## 2019-10-04 DIAGNOSIS — J9611 Chronic respiratory failure with hypoxia: Secondary | ICD-10-CM | POA: Diagnosis not present

## 2019-10-04 DIAGNOSIS — E872 Acidosis: Secondary | ICD-10-CM | POA: Diagnosis not present

## 2019-10-04 DIAGNOSIS — E890 Postprocedural hypothyroidism: Secondary | ICD-10-CM | POA: Diagnosis not present

## 2019-10-04 DIAGNOSIS — L89512 Pressure ulcer of right ankle, stage 2: Secondary | ICD-10-CM | POA: Diagnosis not present

## 2019-10-04 DIAGNOSIS — E039 Hypothyroidism, unspecified: Secondary | ICD-10-CM | POA: Diagnosis not present

## 2019-10-04 DIAGNOSIS — G94 Other disorders of brain in diseases classified elsewhere: Secondary | ICD-10-CM | POA: Diagnosis not present

## 2019-10-04 DIAGNOSIS — K5900 Constipation, unspecified: Secondary | ICD-10-CM | POA: Diagnosis not present

## 2019-10-04 DIAGNOSIS — N1832 Chronic kidney disease, stage 3b: Secondary | ICD-10-CM | POA: Diagnosis not present

## 2019-10-04 DIAGNOSIS — I2510 Atherosclerotic heart disease of native coronary artery without angina pectoris: Secondary | ICD-10-CM | POA: Diagnosis not present

## 2019-10-04 DIAGNOSIS — E161 Other hypoglycemia: Secondary | ICD-10-CM | POA: Diagnosis not present

## 2019-10-05 DIAGNOSIS — E161 Other hypoglycemia: Secondary | ICD-10-CM | POA: Diagnosis not present

## 2019-10-05 DIAGNOSIS — E039 Hypothyroidism, unspecified: Secondary | ICD-10-CM | POA: Diagnosis not present

## 2019-10-05 DIAGNOSIS — N390 Urinary tract infection, site not specified: Secondary | ICD-10-CM | POA: Diagnosis not present

## 2019-10-05 DIAGNOSIS — I2510 Atherosclerotic heart disease of native coronary artery without angina pectoris: Secondary | ICD-10-CM | POA: Diagnosis not present

## 2019-10-05 DIAGNOSIS — Z9981 Dependence on supplemental oxygen: Secondary | ICD-10-CM | POA: Diagnosis not present

## 2019-10-05 DIAGNOSIS — K5900 Constipation, unspecified: Secondary | ICD-10-CM | POA: Diagnosis not present

## 2019-10-05 DIAGNOSIS — N1832 Chronic kidney disease, stage 3b: Secondary | ICD-10-CM | POA: Diagnosis not present

## 2019-10-05 DIAGNOSIS — J9611 Chronic respiratory failure with hypoxia: Secondary | ICD-10-CM | POA: Diagnosis not present

## 2019-10-05 DIAGNOSIS — G94 Other disorders of brain in diseases classified elsewhere: Secondary | ICD-10-CM | POA: Diagnosis not present

## 2019-10-05 DIAGNOSIS — M1990 Unspecified osteoarthritis, unspecified site: Secondary | ICD-10-CM | POA: Diagnosis not present

## 2019-10-05 DIAGNOSIS — K222 Esophageal obstruction: Secondary | ICD-10-CM | POA: Diagnosis not present

## 2019-10-05 DIAGNOSIS — D61818 Other pancytopenia: Secondary | ICD-10-CM | POA: Diagnosis not present

## 2019-10-05 DIAGNOSIS — J449 Chronic obstructive pulmonary disease, unspecified: Secondary | ICD-10-CM | POA: Diagnosis not present

## 2019-10-05 DIAGNOSIS — D631 Anemia in chronic kidney disease: Secondary | ICD-10-CM | POA: Diagnosis not present

## 2019-10-05 DIAGNOSIS — I129 Hypertensive chronic kidney disease with stage 1 through stage 4 chronic kidney disease, or unspecified chronic kidney disease: Secondary | ICD-10-CM | POA: Diagnosis not present

## 2019-10-05 DIAGNOSIS — M549 Dorsalgia, unspecified: Secondary | ICD-10-CM | POA: Diagnosis not present

## 2019-10-05 DIAGNOSIS — L89512 Pressure ulcer of right ankle, stage 2: Secondary | ICD-10-CM | POA: Diagnosis not present

## 2019-10-05 DIAGNOSIS — Z9181 History of falling: Secondary | ICD-10-CM | POA: Diagnosis not present

## 2019-10-05 DIAGNOSIS — L89152 Pressure ulcer of sacral region, stage 2: Secondary | ICD-10-CM | POA: Diagnosis not present

## 2019-10-07 DIAGNOSIS — K222 Esophageal obstruction: Secondary | ICD-10-CM | POA: Diagnosis not present

## 2019-10-07 DIAGNOSIS — D631 Anemia in chronic kidney disease: Secondary | ICD-10-CM | POA: Diagnosis not present

## 2019-10-07 DIAGNOSIS — L89512 Pressure ulcer of right ankle, stage 2: Secondary | ICD-10-CM | POA: Diagnosis not present

## 2019-10-07 DIAGNOSIS — E039 Hypothyroidism, unspecified: Secondary | ICD-10-CM | POA: Diagnosis not present

## 2019-10-07 DIAGNOSIS — M549 Dorsalgia, unspecified: Secondary | ICD-10-CM | POA: Diagnosis not present

## 2019-10-07 DIAGNOSIS — L89152 Pressure ulcer of sacral region, stage 2: Secondary | ICD-10-CM | POA: Diagnosis not present

## 2019-10-07 DIAGNOSIS — K5900 Constipation, unspecified: Secondary | ICD-10-CM | POA: Diagnosis not present

## 2019-10-07 DIAGNOSIS — J9611 Chronic respiratory failure with hypoxia: Secondary | ICD-10-CM | POA: Diagnosis not present

## 2019-10-07 DIAGNOSIS — N1832 Chronic kidney disease, stage 3b: Secondary | ICD-10-CM | POA: Diagnosis not present

## 2019-10-07 DIAGNOSIS — E161 Other hypoglycemia: Secondary | ICD-10-CM | POA: Diagnosis not present

## 2019-10-07 DIAGNOSIS — M1990 Unspecified osteoarthritis, unspecified site: Secondary | ICD-10-CM | POA: Diagnosis not present

## 2019-10-07 DIAGNOSIS — I2510 Atherosclerotic heart disease of native coronary artery without angina pectoris: Secondary | ICD-10-CM | POA: Diagnosis not present

## 2019-10-07 DIAGNOSIS — I129 Hypertensive chronic kidney disease with stage 1 through stage 4 chronic kidney disease, or unspecified chronic kidney disease: Secondary | ICD-10-CM | POA: Diagnosis not present

## 2019-10-07 DIAGNOSIS — N390 Urinary tract infection, site not specified: Secondary | ICD-10-CM | POA: Diagnosis not present

## 2019-10-07 DIAGNOSIS — G94 Other disorders of brain in diseases classified elsewhere: Secondary | ICD-10-CM | POA: Diagnosis not present

## 2019-10-07 DIAGNOSIS — D61818 Other pancytopenia: Secondary | ICD-10-CM | POA: Diagnosis not present

## 2019-10-07 DIAGNOSIS — J449 Chronic obstructive pulmonary disease, unspecified: Secondary | ICD-10-CM | POA: Diagnosis not present

## 2019-10-07 DIAGNOSIS — Z9981 Dependence on supplemental oxygen: Secondary | ICD-10-CM | POA: Diagnosis not present

## 2019-10-07 DIAGNOSIS — Z9181 History of falling: Secondary | ICD-10-CM | POA: Diagnosis not present

## 2019-10-11 DIAGNOSIS — I129 Hypertensive chronic kidney disease with stage 1 through stage 4 chronic kidney disease, or unspecified chronic kidney disease: Secondary | ICD-10-CM | POA: Diagnosis not present

## 2019-10-11 DIAGNOSIS — L89152 Pressure ulcer of sacral region, stage 2: Secondary | ICD-10-CM | POA: Diagnosis not present

## 2019-10-11 DIAGNOSIS — J9611 Chronic respiratory failure with hypoxia: Secondary | ICD-10-CM | POA: Diagnosis not present

## 2019-10-11 DIAGNOSIS — I2510 Atherosclerotic heart disease of native coronary artery without angina pectoris: Secondary | ICD-10-CM | POA: Diagnosis not present

## 2019-10-11 DIAGNOSIS — Z9181 History of falling: Secondary | ICD-10-CM | POA: Diagnosis not present

## 2019-10-11 DIAGNOSIS — N1832 Chronic kidney disease, stage 3b: Secondary | ICD-10-CM | POA: Diagnosis not present

## 2019-10-11 DIAGNOSIS — Z9981 Dependence on supplemental oxygen: Secondary | ICD-10-CM | POA: Diagnosis not present

## 2019-10-11 DIAGNOSIS — M1990 Unspecified osteoarthritis, unspecified site: Secondary | ICD-10-CM | POA: Diagnosis not present

## 2019-10-11 DIAGNOSIS — K222 Esophageal obstruction: Secondary | ICD-10-CM | POA: Diagnosis not present

## 2019-10-11 DIAGNOSIS — D631 Anemia in chronic kidney disease: Secondary | ICD-10-CM | POA: Diagnosis not present

## 2019-10-11 DIAGNOSIS — J449 Chronic obstructive pulmonary disease, unspecified: Secondary | ICD-10-CM | POA: Diagnosis not present

## 2019-10-11 DIAGNOSIS — E039 Hypothyroidism, unspecified: Secondary | ICD-10-CM | POA: Diagnosis not present

## 2019-10-11 DIAGNOSIS — L89512 Pressure ulcer of right ankle, stage 2: Secondary | ICD-10-CM | POA: Diagnosis not present

## 2019-10-11 DIAGNOSIS — N390 Urinary tract infection, site not specified: Secondary | ICD-10-CM | POA: Diagnosis not present

## 2019-10-11 DIAGNOSIS — M549 Dorsalgia, unspecified: Secondary | ICD-10-CM | POA: Diagnosis not present

## 2019-10-11 DIAGNOSIS — E161 Other hypoglycemia: Secondary | ICD-10-CM | POA: Diagnosis not present

## 2019-10-11 DIAGNOSIS — G94 Other disorders of brain in diseases classified elsewhere: Secondary | ICD-10-CM | POA: Diagnosis not present

## 2019-10-11 DIAGNOSIS — K5900 Constipation, unspecified: Secondary | ICD-10-CM | POA: Diagnosis not present

## 2019-10-11 DIAGNOSIS — D61818 Other pancytopenia: Secondary | ICD-10-CM | POA: Diagnosis not present

## 2019-10-13 DIAGNOSIS — M5442 Lumbago with sciatica, left side: Secondary | ICD-10-CM | POA: Diagnosis not present

## 2019-10-13 DIAGNOSIS — M5136 Other intervertebral disc degeneration, lumbar region: Secondary | ICD-10-CM | POA: Diagnosis not present

## 2019-10-14 DIAGNOSIS — K222 Esophageal obstruction: Secondary | ICD-10-CM | POA: Diagnosis not present

## 2019-10-14 DIAGNOSIS — N390 Urinary tract infection, site not specified: Secondary | ICD-10-CM | POA: Diagnosis not present

## 2019-10-14 DIAGNOSIS — I2510 Atherosclerotic heart disease of native coronary artery without angina pectoris: Secondary | ICD-10-CM | POA: Diagnosis not present

## 2019-10-14 DIAGNOSIS — Z9981 Dependence on supplemental oxygen: Secondary | ICD-10-CM | POA: Diagnosis not present

## 2019-10-14 DIAGNOSIS — L89512 Pressure ulcer of right ankle, stage 2: Secondary | ICD-10-CM | POA: Diagnosis not present

## 2019-10-14 DIAGNOSIS — G94 Other disorders of brain in diseases classified elsewhere: Secondary | ICD-10-CM | POA: Diagnosis not present

## 2019-10-14 DIAGNOSIS — E161 Other hypoglycemia: Secondary | ICD-10-CM | POA: Diagnosis not present

## 2019-10-14 DIAGNOSIS — I129 Hypertensive chronic kidney disease with stage 1 through stage 4 chronic kidney disease, or unspecified chronic kidney disease: Secondary | ICD-10-CM | POA: Diagnosis not present

## 2019-10-14 DIAGNOSIS — D631 Anemia in chronic kidney disease: Secondary | ICD-10-CM | POA: Diagnosis not present

## 2019-10-14 DIAGNOSIS — L89152 Pressure ulcer of sacral region, stage 2: Secondary | ICD-10-CM | POA: Diagnosis not present

## 2019-10-14 DIAGNOSIS — M549 Dorsalgia, unspecified: Secondary | ICD-10-CM | POA: Diagnosis not present

## 2019-10-14 DIAGNOSIS — M1990 Unspecified osteoarthritis, unspecified site: Secondary | ICD-10-CM | POA: Diagnosis not present

## 2019-10-14 DIAGNOSIS — J449 Chronic obstructive pulmonary disease, unspecified: Secondary | ICD-10-CM | POA: Diagnosis not present

## 2019-10-14 DIAGNOSIS — E039 Hypothyroidism, unspecified: Secondary | ICD-10-CM | POA: Diagnosis not present

## 2019-10-14 DIAGNOSIS — Z9181 History of falling: Secondary | ICD-10-CM | POA: Diagnosis not present

## 2019-10-14 DIAGNOSIS — D61818 Other pancytopenia: Secondary | ICD-10-CM | POA: Diagnosis not present

## 2019-10-14 DIAGNOSIS — K5900 Constipation, unspecified: Secondary | ICD-10-CM | POA: Diagnosis not present

## 2019-10-14 DIAGNOSIS — J9611 Chronic respiratory failure with hypoxia: Secondary | ICD-10-CM | POA: Diagnosis not present

## 2019-10-14 DIAGNOSIS — N1832 Chronic kidney disease, stage 3b: Secondary | ICD-10-CM | POA: Diagnosis not present

## 2019-10-18 DIAGNOSIS — N390 Urinary tract infection, site not specified: Secondary | ICD-10-CM | POA: Diagnosis not present

## 2019-10-18 DIAGNOSIS — J449 Chronic obstructive pulmonary disease, unspecified: Secondary | ICD-10-CM | POA: Diagnosis not present

## 2019-10-18 DIAGNOSIS — E161 Other hypoglycemia: Secondary | ICD-10-CM | POA: Diagnosis not present

## 2019-10-18 DIAGNOSIS — L89152 Pressure ulcer of sacral region, stage 2: Secondary | ICD-10-CM | POA: Diagnosis not present

## 2019-10-18 DIAGNOSIS — Z9981 Dependence on supplemental oxygen: Secondary | ICD-10-CM | POA: Diagnosis not present

## 2019-10-18 DIAGNOSIS — N1832 Chronic kidney disease, stage 3b: Secondary | ICD-10-CM | POA: Diagnosis not present

## 2019-10-18 DIAGNOSIS — Z9181 History of falling: Secondary | ICD-10-CM | POA: Diagnosis not present

## 2019-10-18 DIAGNOSIS — D61818 Other pancytopenia: Secondary | ICD-10-CM | POA: Diagnosis not present

## 2019-10-18 DIAGNOSIS — M1990 Unspecified osteoarthritis, unspecified site: Secondary | ICD-10-CM | POA: Diagnosis not present

## 2019-10-18 DIAGNOSIS — J9611 Chronic respiratory failure with hypoxia: Secondary | ICD-10-CM | POA: Diagnosis not present

## 2019-10-18 DIAGNOSIS — K5900 Constipation, unspecified: Secondary | ICD-10-CM | POA: Diagnosis not present

## 2019-10-18 DIAGNOSIS — E039 Hypothyroidism, unspecified: Secondary | ICD-10-CM | POA: Diagnosis not present

## 2019-10-18 DIAGNOSIS — G94 Other disorders of brain in diseases classified elsewhere: Secondary | ICD-10-CM | POA: Diagnosis not present

## 2019-10-18 DIAGNOSIS — I2510 Atherosclerotic heart disease of native coronary artery without angina pectoris: Secondary | ICD-10-CM | POA: Diagnosis not present

## 2019-10-18 DIAGNOSIS — D631 Anemia in chronic kidney disease: Secondary | ICD-10-CM | POA: Diagnosis not present

## 2019-10-18 DIAGNOSIS — I129 Hypertensive chronic kidney disease with stage 1 through stage 4 chronic kidney disease, or unspecified chronic kidney disease: Secondary | ICD-10-CM | POA: Diagnosis not present

## 2019-10-18 DIAGNOSIS — K222 Esophageal obstruction: Secondary | ICD-10-CM | POA: Diagnosis not present

## 2019-10-18 DIAGNOSIS — M549 Dorsalgia, unspecified: Secondary | ICD-10-CM | POA: Diagnosis not present

## 2019-10-18 DIAGNOSIS — L89512 Pressure ulcer of right ankle, stage 2: Secondary | ICD-10-CM | POA: Diagnosis not present

## 2019-10-21 DIAGNOSIS — D61818 Other pancytopenia: Secondary | ICD-10-CM | POA: Diagnosis not present

## 2019-10-21 DIAGNOSIS — E162 Hypoglycemia, unspecified: Secondary | ICD-10-CM | POA: Diagnosis not present

## 2019-10-21 DIAGNOSIS — M1990 Unspecified osteoarthritis, unspecified site: Secondary | ICD-10-CM | POA: Diagnosis not present

## 2019-10-21 DIAGNOSIS — K219 Gastro-esophageal reflux disease without esophagitis: Secondary | ICD-10-CM | POA: Diagnosis not present

## 2019-10-21 DIAGNOSIS — L89512 Pressure ulcer of right ankle, stage 2: Secondary | ICD-10-CM | POA: Diagnosis not present

## 2019-10-21 DIAGNOSIS — M549 Dorsalgia, unspecified: Secondary | ICD-10-CM | POA: Diagnosis not present

## 2019-10-21 DIAGNOSIS — N390 Urinary tract infection, site not specified: Secondary | ICD-10-CM | POA: Diagnosis not present

## 2019-10-21 DIAGNOSIS — D649 Anemia, unspecified: Secondary | ICD-10-CM | POA: Diagnosis not present

## 2019-10-21 DIAGNOSIS — K222 Esophageal obstruction: Secondary | ICD-10-CM | POA: Diagnosis not present

## 2019-10-21 DIAGNOSIS — Z9181 History of falling: Secondary | ICD-10-CM | POA: Diagnosis not present

## 2019-10-21 DIAGNOSIS — K5900 Constipation, unspecified: Secondary | ICD-10-CM | POA: Diagnosis not present

## 2019-10-21 DIAGNOSIS — L89152 Pressure ulcer of sacral region, stage 2: Secondary | ICD-10-CM | POA: Diagnosis not present

## 2019-10-21 DIAGNOSIS — I129 Hypertensive chronic kidney disease with stage 1 through stage 4 chronic kidney disease, or unspecified chronic kidney disease: Secondary | ICD-10-CM | POA: Diagnosis not present

## 2019-10-21 DIAGNOSIS — Z9981 Dependence on supplemental oxygen: Secondary | ICD-10-CM | POA: Diagnosis not present

## 2019-10-21 DIAGNOSIS — E161 Other hypoglycemia: Secondary | ICD-10-CM | POA: Diagnosis not present

## 2019-10-21 DIAGNOSIS — E039 Hypothyroidism, unspecified: Secondary | ICD-10-CM | POA: Diagnosis not present

## 2019-10-21 DIAGNOSIS — Z79899 Other long term (current) drug therapy: Secondary | ICD-10-CM | POA: Diagnosis not present

## 2019-10-21 DIAGNOSIS — J9611 Chronic respiratory failure with hypoxia: Secondary | ICD-10-CM | POA: Diagnosis not present

## 2019-10-21 DIAGNOSIS — I2510 Atherosclerotic heart disease of native coronary artery without angina pectoris: Secondary | ICD-10-CM | POA: Diagnosis not present

## 2019-10-21 DIAGNOSIS — J449 Chronic obstructive pulmonary disease, unspecified: Secondary | ICD-10-CM | POA: Diagnosis not present

## 2019-10-21 DIAGNOSIS — G94 Other disorders of brain in diseases classified elsewhere: Secondary | ICD-10-CM | POA: Diagnosis not present

## 2019-10-21 DIAGNOSIS — N1832 Chronic kidney disease, stage 3b: Secondary | ICD-10-CM | POA: Diagnosis not present

## 2019-10-21 DIAGNOSIS — R609 Edema, unspecified: Secondary | ICD-10-CM | POA: Diagnosis not present

## 2019-10-21 DIAGNOSIS — D631 Anemia in chronic kidney disease: Secondary | ICD-10-CM | POA: Diagnosis not present

## 2019-10-22 DIAGNOSIS — M549 Dorsalgia, unspecified: Secondary | ICD-10-CM | POA: Diagnosis not present

## 2019-10-22 DIAGNOSIS — E161 Other hypoglycemia: Secondary | ICD-10-CM | POA: Diagnosis not present

## 2019-10-22 DIAGNOSIS — N1832 Chronic kidney disease, stage 3b: Secondary | ICD-10-CM | POA: Diagnosis not present

## 2019-10-22 DIAGNOSIS — L89512 Pressure ulcer of right ankle, stage 2: Secondary | ICD-10-CM | POA: Diagnosis not present

## 2019-10-22 DIAGNOSIS — N390 Urinary tract infection, site not specified: Secondary | ICD-10-CM | POA: Diagnosis not present

## 2019-10-22 DIAGNOSIS — Z9981 Dependence on supplemental oxygen: Secondary | ICD-10-CM | POA: Diagnosis not present

## 2019-10-22 DIAGNOSIS — G94 Other disorders of brain in diseases classified elsewhere: Secondary | ICD-10-CM | POA: Diagnosis not present

## 2019-10-22 DIAGNOSIS — I10 Essential (primary) hypertension: Secondary | ICD-10-CM | POA: Diagnosis not present

## 2019-10-22 DIAGNOSIS — D61818 Other pancytopenia: Secondary | ICD-10-CM | POA: Diagnosis not present

## 2019-10-22 DIAGNOSIS — M1990 Unspecified osteoarthritis, unspecified site: Secondary | ICD-10-CM | POA: Diagnosis not present

## 2019-10-22 DIAGNOSIS — Z9181 History of falling: Secondary | ICD-10-CM | POA: Diagnosis not present

## 2019-10-22 DIAGNOSIS — D649 Anemia, unspecified: Secondary | ICD-10-CM | POA: Diagnosis not present

## 2019-10-22 DIAGNOSIS — M869 Osteomyelitis, unspecified: Secondary | ICD-10-CM | POA: Diagnosis not present

## 2019-10-22 DIAGNOSIS — K222 Esophageal obstruction: Secondary | ICD-10-CM | POA: Diagnosis not present

## 2019-10-22 DIAGNOSIS — E039 Hypothyroidism, unspecified: Secondary | ICD-10-CM | POA: Diagnosis not present

## 2019-10-22 DIAGNOSIS — D631 Anemia in chronic kidney disease: Secondary | ICD-10-CM | POA: Diagnosis not present

## 2019-10-22 DIAGNOSIS — K5900 Constipation, unspecified: Secondary | ICD-10-CM | POA: Diagnosis not present

## 2019-10-22 DIAGNOSIS — L89152 Pressure ulcer of sacral region, stage 2: Secondary | ICD-10-CM | POA: Diagnosis not present

## 2019-10-22 DIAGNOSIS — J449 Chronic obstructive pulmonary disease, unspecified: Secondary | ICD-10-CM | POA: Diagnosis not present

## 2019-10-22 DIAGNOSIS — I2510 Atherosclerotic heart disease of native coronary artery without angina pectoris: Secondary | ICD-10-CM | POA: Diagnosis not present

## 2019-10-22 DIAGNOSIS — L89153 Pressure ulcer of sacral region, stage 3: Secondary | ICD-10-CM | POA: Diagnosis not present

## 2019-10-22 DIAGNOSIS — I129 Hypertensive chronic kidney disease with stage 1 through stage 4 chronic kidney disease, or unspecified chronic kidney disease: Secondary | ICD-10-CM | POA: Diagnosis not present

## 2019-10-22 DIAGNOSIS — J9611 Chronic respiratory failure with hypoxia: Secondary | ICD-10-CM | POA: Diagnosis not present

## 2019-10-25 DIAGNOSIS — J9611 Chronic respiratory failure with hypoxia: Secondary | ICD-10-CM | POA: Diagnosis not present

## 2019-10-25 DIAGNOSIS — I2510 Atherosclerotic heart disease of native coronary artery without angina pectoris: Secondary | ICD-10-CM | POA: Diagnosis not present

## 2019-10-25 DIAGNOSIS — K222 Esophageal obstruction: Secondary | ICD-10-CM | POA: Diagnosis not present

## 2019-10-25 DIAGNOSIS — D631 Anemia in chronic kidney disease: Secondary | ICD-10-CM | POA: Diagnosis not present

## 2019-10-25 DIAGNOSIS — E161 Other hypoglycemia: Secondary | ICD-10-CM | POA: Diagnosis not present

## 2019-10-25 DIAGNOSIS — L89512 Pressure ulcer of right ankle, stage 2: Secondary | ICD-10-CM | POA: Diagnosis not present

## 2019-10-25 DIAGNOSIS — I129 Hypertensive chronic kidney disease with stage 1 through stage 4 chronic kidney disease, or unspecified chronic kidney disease: Secondary | ICD-10-CM | POA: Diagnosis not present

## 2019-10-25 DIAGNOSIS — N1832 Chronic kidney disease, stage 3b: Secondary | ICD-10-CM | POA: Diagnosis not present

## 2019-10-25 DIAGNOSIS — Z9981 Dependence on supplemental oxygen: Secondary | ICD-10-CM | POA: Diagnosis not present

## 2019-10-25 DIAGNOSIS — L89152 Pressure ulcer of sacral region, stage 2: Secondary | ICD-10-CM | POA: Diagnosis not present

## 2019-10-25 DIAGNOSIS — N390 Urinary tract infection, site not specified: Secondary | ICD-10-CM | POA: Diagnosis not present

## 2019-10-25 DIAGNOSIS — G94 Other disorders of brain in diseases classified elsewhere: Secondary | ICD-10-CM | POA: Diagnosis not present

## 2019-10-25 DIAGNOSIS — D61818 Other pancytopenia: Secondary | ICD-10-CM | POA: Diagnosis not present

## 2019-10-25 DIAGNOSIS — Z9181 History of falling: Secondary | ICD-10-CM | POA: Diagnosis not present

## 2019-10-25 DIAGNOSIS — K5900 Constipation, unspecified: Secondary | ICD-10-CM | POA: Diagnosis not present

## 2019-10-25 DIAGNOSIS — E039 Hypothyroidism, unspecified: Secondary | ICD-10-CM | POA: Diagnosis not present

## 2019-10-25 DIAGNOSIS — M549 Dorsalgia, unspecified: Secondary | ICD-10-CM | POA: Diagnosis not present

## 2019-10-25 DIAGNOSIS — M1990 Unspecified osteoarthritis, unspecified site: Secondary | ICD-10-CM | POA: Diagnosis not present

## 2019-10-25 DIAGNOSIS — J449 Chronic obstructive pulmonary disease, unspecified: Secondary | ICD-10-CM | POA: Diagnosis not present

## 2019-12-08 DIAGNOSIS — J440 Chronic obstructive pulmonary disease with acute lower respiratory infection: Secondary | ICD-10-CM | POA: Insufficient documentation

## 2019-12-09 DIAGNOSIS — D696 Thrombocytopenia, unspecified: Secondary | ICD-10-CM | POA: Insufficient documentation

## 2020-02-11 DIAGNOSIS — J449 Chronic obstructive pulmonary disease, unspecified: Secondary | ICD-10-CM | POA: Insufficient documentation

## 2020-02-12 DIAGNOSIS — B9620 Unspecified Escherichia coli [E. coli] as the cause of diseases classified elsewhere: Secondary | ICD-10-CM | POA: Insufficient documentation

## 2020-02-12 DIAGNOSIS — R7881 Bacteremia: Secondary | ICD-10-CM | POA: Insufficient documentation

## 2020-02-12 DIAGNOSIS — E440 Moderate protein-calorie malnutrition: Secondary | ICD-10-CM | POA: Insufficient documentation

## 2020-03-09 ENCOUNTER — Telehealth: Payer: Self-pay | Admitting: Oncology

## 2020-03-09 NOTE — Telephone Encounter (Signed)
Per Dr Cher Nakai, Appt made for 1/26 Labs 3:15 pm - Consult 4:00 pm.  Scanning current  Office in Chart.  Requested Labs to be refax due to missing results

## 2020-03-20 NOTE — Progress Notes (Incomplete)
Cynthia Benson  [address] [place],  [place]  [address] [phone]  Clinic Day:  03/20/2020  Referring physician: Lou Miner, MD   This document serves as a record of services personally performed by Hosie Poisson, MD. It was created on their behalf by Curry,Lauren E, a trained medical scribe. The creation of this record is based on the scribe's personal observations and the provider's statements to them.   CHIEF COMPLAINT:  CC: Anemia  Current Treatment:  Surveillance   HISTORY OF PRESENT ILLNESS:  Cynthia Benson Cynthia Benson is a 80 y.o. female with anemia.  She describes being anemic for years.  She was found to be anemic at Dr. Marguerita Beards office in October of 2019 where her hemoglobin was 10.1 with an MCV of 106.  White count and platelets were within the normal range at that time.  Most recent lab work up from Dr. Marguerita Beards office in October 2020 revealed a low hemoglobin of 8.6 with an MCV of 97.  White count and platelets were normal.  Iron studies revealed an iron level of 45 with a low TIBC of 191 for a percent saturation of 24%.  Serum ferritin was elevated at 327 and B12 level was elevated at 1570.  Folate level was within the normal range at 6.9.  She states that she has known she was anemic for several years.  She has had one transfusion in the past, many years ago.  She denies hematuria, melena, epistaxis, or other forms of overt bleeding.  She does note that she bruises easily.  She also notes fatigue.  She denies pica to ice.  She reports intermittent headaches.  She notes pain in her back and legs due to arthritis.  She was hospitalized back in November 2020 due to pneumonia at the hospital in [place].  Her hemoglobin today is 9.1 with an elevated MCV of 106.  White count and platelets remain normal.  Her chemistries are unremarkable except for a BUN of 19.  LDH is within the normal range at 574.  Her appetite is fair, and she is eating well.  She  has not had any significant weight loss.  She denies fever or chills.  She denies nausea, vomiting, bowel issues, or abdominal pain.  She denies sore throat, cough, dyspnea, or chest pain.  She started menarche around age 43, and has had 5 pregnancies with 5 live births.  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  She received her 1st COVID-19 vaccine recently.   INTERVAL HISTORY:  [name] is here for follow up ***.   Her  appetite is good, and she has gained/lost _ pounds since her last visit.  She denies fever, chills or other signs of infection.  She denies nausea, vomiting, bowel issues, or abdominal pain.  She denies sore throat, cough, dyspnea, or chest pain.   REVIEW OF SYSTEMS:  Review of Systems - Oncology   VITALS:  There were no vitals taken for this visit.  Wt Readings from Last 3 Encounters:  No data found for Wt    There is no height or weight on file to calculate BMI.  Performance status (ECOG): {CHL ONC [id number]  PHYSICAL EXAM:  Physical Exam  LABS:   CBC Latest Ref Rng & Units 08/12/2006  WBC 4.5 - 10.5 10*3/microliter 5.2  Hemoglobin 12.0 - 15.0 g/dL 11.1(L)  Hematocrit 36.0 - 46.0 % 32.0(L)  Platelets 150 -  400 K/uL 257   CMP Latest Ref Rng & Units 08/12/2006  Glucose 70 - 99 mg/dL 95  BUN 6 - 23 mg/dL 11  Creatinine 0.4 - 1.2 mg/dL 0.8  Sodium 135 - 145 meq/L 142  Potassium 3.5 - 5.1 meq/L 4.4  Chloride 96 - 112 meq/L 102  CO2 19 - 32 meq/L 31  Calcium 8.4 - 10.5 mg/dL 8.5     No results found for: CEA1 / No results found for: CEA1 No results found for: PSA1 No results found for: SAY301 No results found for: SWF093  No results found for: TOTALPROTELP, ALBUMINELP, A1GS, A2GS, BETS, BETA2SER, GAMS, MSPIKE, SPEI No results found for: TIBC, FERRITIN, IRONPCTSAT No results found for: LDH   STUDIES:  No results found.   Allergies: Not on File  Current Medications: No current  outpatient medications on file.   No current facility-administered medications for this visit.     ASSESSMENT & PLAN:   Assessment:   1.  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   She has only required one transfusion in the past, many years ago.  I have recommended that she be monitored routinely.    Plan: She is doing fairly well otherwise, and has not required regular transfusions.  I will not schedule her for a follow up, but will be glad to see her if needed, or if her anemia significantly worsens.  I will notify her of the other pending lab results, and forward them to Dr. Truman Hayward.  She and her husband understand and agree to this plan of care.  I have answered their questions and they know to call with any concerns.   I provided *** minutes (11:12 AM - 11:12 AM) of face-to-face time during this this encounter and > 50% was spent counseling as documented under my assessment and plan.    Derwood Kaplan, MD [facility] [address] [place] [address] Dept: [phone] Dept Fax: [phone]   I, Rita Ohara, am acting as scribe for Derwood Kaplan, MD  I have reviewed this report as typed by the medical scribe, and it is complete and accurate.

## 2020-03-22 ENCOUNTER — Other Ambulatory Visit: Payer: Self-pay | Admitting: Oncology

## 2020-03-22 ENCOUNTER — Inpatient Hospital Stay: Payer: Medicare Other | Admitting: Oncology

## 2020-03-22 ENCOUNTER — Inpatient Hospital Stay: Payer: Medicare Other | Attending: Oncology

## 2020-03-22 DIAGNOSIS — D649 Anemia, unspecified: Secondary | ICD-10-CM | POA: Insufficient documentation

## 2020-06-22 DIAGNOSIS — L89511 Pressure ulcer of right ankle, stage 1: Secondary | ICD-10-CM | POA: Insufficient documentation

## 2020-09-21 ENCOUNTER — Encounter (HOSPITAL_COMMUNITY): Payer: Self-pay

## 2020-09-21 ENCOUNTER — Emergency Department (HOSPITAL_COMMUNITY): Payer: Medicare Other

## 2020-09-21 ENCOUNTER — Inpatient Hospital Stay (HOSPITAL_COMMUNITY): Payer: Medicare Other

## 2020-09-21 ENCOUNTER — Inpatient Hospital Stay (HOSPITAL_COMMUNITY)
Admission: EM | Admit: 2020-09-21 | Discharge: 2020-09-27 | DRG: 871 | Disposition: A | Discharge status: Home Health | Payer: Medicare Other | Attending: Family Medicine | Admitting: Family Medicine

## 2020-09-21 DIAGNOSIS — J9602 Acute respiratory failure with hypercapnia: Secondary | ICD-10-CM | POA: Diagnosis present

## 2020-09-21 DIAGNOSIS — F419 Anxiety disorder, unspecified: Secondary | ICD-10-CM | POA: Diagnosis present

## 2020-09-21 DIAGNOSIS — A419 Sepsis, unspecified organism: Principal | ICD-10-CM | POA: Diagnosis present

## 2020-09-21 DIAGNOSIS — F5104 Psychophysiologic insomnia: Secondary | ICD-10-CM | POA: Diagnosis present

## 2020-09-21 DIAGNOSIS — G928 Other toxic encephalopathy: Secondary | ICD-10-CM | POA: Diagnosis present

## 2020-09-21 DIAGNOSIS — J189 Pneumonia, unspecified organism: Secondary | ICD-10-CM

## 2020-09-21 DIAGNOSIS — E890 Postprocedural hypothyroidism: Secondary | ICD-10-CM | POA: Diagnosis present

## 2020-09-21 DIAGNOSIS — W19XXXA Unspecified fall, initial encounter: Secondary | ICD-10-CM

## 2020-09-21 DIAGNOSIS — E872 Acidosis: Secondary | ICD-10-CM | POA: Diagnosis present

## 2020-09-21 DIAGNOSIS — Z7189 Other specified counseling: Secondary | ICD-10-CM | POA: Diagnosis not present

## 2020-09-21 DIAGNOSIS — Z20822 Contact with and (suspected) exposure to covid-19: Secondary | ICD-10-CM | POA: Diagnosis present

## 2020-09-21 DIAGNOSIS — Y92008 Other place in unspecified non-institutional (private) residence as the place of occurrence of the external cause: Secondary | ICD-10-CM

## 2020-09-21 DIAGNOSIS — R402431 Glasgow coma scale score 3-8, in the field [EMT or ambulance]: Secondary | ICD-10-CM | POA: Diagnosis not present

## 2020-09-21 DIAGNOSIS — J690 Pneumonitis due to inhalation of food and vomit: Secondary | ICD-10-CM | POA: Diagnosis present

## 2020-09-21 DIAGNOSIS — E039 Hypothyroidism, unspecified: Secondary | ICD-10-CM | POA: Diagnosis not present

## 2020-09-21 DIAGNOSIS — R29810 Facial weakness: Secondary | ICD-10-CM | POA: Diagnosis present

## 2020-09-21 DIAGNOSIS — J449 Chronic obstructive pulmonary disease, unspecified: Secondary | ICD-10-CM | POA: Diagnosis present

## 2020-09-21 DIAGNOSIS — T82838A Hemorrhage of vascular prosthetic devices, implants and grafts, initial encounter: Secondary | ICD-10-CM | POA: Diagnosis not present

## 2020-09-21 DIAGNOSIS — Y832 Surgical operation with anastomosis, bypass or graft as the cause of abnormal reaction of the patient, or of later complication, without mention of misadventure at the time of the procedure: Secondary | ICD-10-CM | POA: Diagnosis not present

## 2020-09-21 DIAGNOSIS — S0511XA Contusion of eyeball and orbital tissues, right eye, initial encounter: Secondary | ICD-10-CM | POA: Diagnosis present

## 2020-09-21 DIAGNOSIS — E876 Hypokalemia: Secondary | ICD-10-CM | POA: Diagnosis present

## 2020-09-21 DIAGNOSIS — S0003XA Contusion of scalp, initial encounter: Secondary | ICD-10-CM | POA: Diagnosis present

## 2020-09-21 DIAGNOSIS — Z903 Acquired absence of stomach [part of]: Secondary | ICD-10-CM | POA: Diagnosis not present

## 2020-09-21 DIAGNOSIS — Z515 Encounter for palliative care: Secondary | ICD-10-CM | POA: Diagnosis not present

## 2020-09-21 DIAGNOSIS — G9341 Metabolic encephalopathy: Secondary | ICD-10-CM | POA: Diagnosis not present

## 2020-09-21 DIAGNOSIS — T481X5A Adverse effect of skeletal muscle relaxants [neuromuscular blocking agents], initial encounter: Secondary | ICD-10-CM | POA: Diagnosis present

## 2020-09-21 DIAGNOSIS — R578 Other shock: Secondary | ICD-10-CM | POA: Diagnosis not present

## 2020-09-21 DIAGNOSIS — T424X5A Adverse effect of benzodiazepines, initial encounter: Secondary | ICD-10-CM | POA: Diagnosis present

## 2020-09-21 DIAGNOSIS — Z0189 Encounter for other specified special examinations: Secondary | ICD-10-CM

## 2020-09-21 DIAGNOSIS — Z79899 Other long term (current) drug therapy: Secondary | ICD-10-CM

## 2020-09-21 DIAGNOSIS — Z452 Encounter for adjustment and management of vascular access device: Secondary | ICD-10-CM

## 2020-09-21 DIAGNOSIS — J9600 Acute respiratory failure, unspecified whether with hypoxia or hypercapnia: Secondary | ICD-10-CM | POA: Diagnosis not present

## 2020-09-21 DIAGNOSIS — R579 Shock, unspecified: Secondary | ICD-10-CM | POA: Diagnosis not present

## 2020-09-21 DIAGNOSIS — R4182 Altered mental status, unspecified: Secondary | ICD-10-CM | POA: Diagnosis present

## 2020-09-21 DIAGNOSIS — Z79890 Hormone replacement therapy (postmenopausal): Secondary | ICD-10-CM | POA: Diagnosis not present

## 2020-09-21 DIAGNOSIS — I10 Essential (primary) hypertension: Secondary | ICD-10-CM | POA: Diagnosis present

## 2020-09-21 DIAGNOSIS — T391X5A Adverse effect of 4-Aminophenol derivatives, initial encounter: Secondary | ICD-10-CM | POA: Diagnosis present

## 2020-09-21 DIAGNOSIS — S0083XA Contusion of other part of head, initial encounter: Secondary | ICD-10-CM | POA: Diagnosis present

## 2020-09-21 DIAGNOSIS — W1830XA Fall on same level, unspecified, initial encounter: Secondary | ICD-10-CM | POA: Diagnosis present

## 2020-09-21 DIAGNOSIS — R29716 NIHSS score 16: Secondary | ICD-10-CM | POA: Diagnosis present

## 2020-09-21 DIAGNOSIS — D62 Acute posthemorrhagic anemia: Secondary | ICD-10-CM | POA: Diagnosis present

## 2020-09-21 DIAGNOSIS — G894 Chronic pain syndrome: Secondary | ICD-10-CM | POA: Diagnosis present

## 2020-09-21 DIAGNOSIS — Z66 Do not resuscitate: Secondary | ICD-10-CM | POA: Diagnosis not present

## 2020-09-21 DIAGNOSIS — J9601 Acute respiratory failure with hypoxia: Secondary | ICD-10-CM | POA: Diagnosis present

## 2020-09-21 LAB — POCT I-STAT 7, (LYTES, BLD GAS, ICA,H+H)
Acid-base deficit: 3 mmol/L — ABNORMAL HIGH (ref 0.0–2.0)
Bicarbonate: 22 mmol/L (ref 20.0–28.0)
Calcium, Ion: 1.22 mmol/L (ref 1.15–1.40)
HCT: 30 % — ABNORMAL LOW (ref 36.0–46.0)
Hemoglobin: 10.2 g/dL — ABNORMAL LOW (ref 12.0–15.0)
O2 Saturation: 95 %
Patient temperature: 97.3
Potassium: 4 mmol/L (ref 3.5–5.1)
Sodium: 145 mmol/L (ref 135–145)
TCO2: 23 mmol/L (ref 22–32)
pCO2 arterial: 38.2 mmHg (ref 32.0–48.0)
pH, Arterial: 7.365 (ref 7.350–7.450)
pO2, Arterial: 78 mmHg — ABNORMAL LOW (ref 83.0–108.0)

## 2020-09-21 LAB — RAPID URINE DRUG SCREEN, HOSP PERFORMED
Amphetamines: NOT DETECTED
Barbiturates: NOT DETECTED
Benzodiazepines: POSITIVE — AB
Cocaine: NOT DETECTED
Opiates: NOT DETECTED
Tetrahydrocannabinol: NOT DETECTED

## 2020-09-21 LAB — BASIC METABOLIC PANEL
Anion gap: 5 (ref 5–15)
BUN: 21 mg/dL (ref 8–23)
CO2: 22 mmol/L (ref 22–32)
Calcium: 8 mg/dL — ABNORMAL LOW (ref 8.9–10.3)
Chloride: 112 mmol/L — ABNORMAL HIGH (ref 98–111)
Creatinine, Ser: 0.85 mg/dL (ref 0.44–1.00)
GFR, Estimated: >60 mL/min (ref >60)
Glucose, Bld: 168 mg/dL — ABNORMAL HIGH (ref 70–99)
Potassium: 5.2 mmol/L — ABNORMAL HIGH (ref 3.5–5.1)
Sodium: 139 mmol/L (ref 135–145)

## 2020-09-21 LAB — I-STAT ARTERIAL BLOOD GAS, ED
Acid-base deficit: 4 mmol/L — ABNORMAL HIGH (ref 0.0–2.0)
Bicarbonate: 25.1 mmol/L (ref 20.0–28.0)
Calcium, Ion: 1.22 mmol/L (ref 1.15–1.40)
HCT: 24 % — ABNORMAL LOW (ref 36.0–46.0)
Hemoglobin: 8.2 g/dL — ABNORMAL LOW (ref 12.0–15.0)
O2 Saturation: 100 %
Patient temperature: 98.6
Potassium: 4.6 mmol/L (ref 3.5–5.1)
Sodium: 142 mmol/L (ref 135–145)
TCO2: 27 mmol/L (ref 22–32)
pCO2 arterial: 71.7 mmHg (ref 32.0–48.0)
pH, Arterial: 7.152 — CL (ref 7.350–7.450)
pO2, Arterial: 316 mmHg — ABNORMAL HIGH (ref 83.0–108.0)

## 2020-09-21 LAB — DIFFERENTIAL
Abs Immature Granulocytes: 0.62 10*3/uL — ABNORMAL HIGH (ref 0.00–0.07)
Basophils Absolute: 0.1 10*3/uL (ref 0.0–0.1)
Basophils Relative: 1 %
Eosinophils Absolute: 0.1 10*3/uL (ref 0.0–0.5)
Eosinophils Relative: 1 %
Immature Granulocytes: 6 %
Lymphocytes Relative: 10 %
Lymphs Abs: 1 10*3/uL (ref 0.7–4.0)
Monocytes Absolute: 0.7 10*3/uL (ref 0.1–1.0)
Monocytes Relative: 6 %
Neutro Abs: 8.1 10*3/uL — ABNORMAL HIGH (ref 1.7–7.7)
Neutrophils Relative %: 76 %

## 2020-09-21 LAB — CBG MONITORING, ED: Glucose-Capillary: 188 mg/dL — ABNORMAL HIGH (ref 70–99)

## 2020-09-21 LAB — COMPREHENSIVE METABOLIC PANEL
ALT: 22 U/L (ref 0–44)
AST: 34 U/L (ref 15–41)
Albumin: 3.8 g/dL (ref 3.5–5.0)
Alkaline Phosphatase: 149 U/L — ABNORMAL HIGH (ref 38–126)
Anion gap: 8 (ref 5–15)
BUN: 22 mg/dL (ref 8–23)
CO2: 19 mmol/L — ABNORMAL LOW (ref 22–32)
Calcium: 8.5 mg/dL — ABNORMAL LOW (ref 8.9–10.3)
Chloride: 114 mmol/L — ABNORMAL HIGH (ref 98–111)
Creatinine, Ser: 0.96 mg/dL (ref 0.44–1.00)
GFR, Estimated: 60 mL/min — ABNORMAL LOW (ref >60)
Glucose, Bld: 176 mg/dL — ABNORMAL HIGH (ref 70–99)
Potassium: 5 mmol/L (ref 3.5–5.1)
Sodium: 141 mmol/L (ref 135–145)
Total Bilirubin: 0.7 mg/dL (ref 0.3–1.2)
Total Protein: 7.1 g/dL (ref 6.5–8.1)

## 2020-09-21 LAB — LACTIC ACID, PLASMA
Lactic Acid, Venous: 1.6 mmol/L (ref 0.5–1.9)
Lactic Acid, Venous: 3.5 mmol/L (ref 0.5–1.9)
Lactic Acid, Venous: 3.5 mmol/L (ref 0.5–1.9)
Lactic Acid, Venous: 5.6 mmol/L (ref 0.5–1.9)
Lactic Acid, Venous: 2.3 mmol/L (ref 0.5–1.9)

## 2020-09-21 LAB — I-STAT CHEM 8, ED
BUN: 23 mg/dL (ref 8–23)
Calcium, Ion: 1.1 mmol/L — ABNORMAL LOW (ref 1.15–1.40)
Chloride: 114 mmol/L — ABNORMAL HIGH (ref 98–111)
Creatinine, Ser: 0.8 mg/dL (ref 0.44–1.00)
Glucose, Bld: 177 mg/dL — ABNORMAL HIGH (ref 70–99)
HCT: 29 % — ABNORMAL LOW (ref 36.0–46.0)
Hemoglobin: 9.9 g/dL — ABNORMAL LOW (ref 12.0–15.0)
Potassium: 5 mmol/L (ref 3.5–5.1)
Sodium: 143 mmol/L (ref 135–145)
TCO2: 21 mmol/L — ABNORMAL LOW (ref 22–32)

## 2020-09-21 LAB — CBC
HCT: 28.3 % — ABNORMAL LOW (ref 36.0–46.0)
HCT: 23.5 % — ABNORMAL LOW (ref 36.0–46.0)
Hemoglobin: 8.6 g/dL — ABNORMAL LOW (ref 12.0–15.0)
Hemoglobin: 7.2 g/dL — ABNORMAL LOW (ref 12.0–15.0)
MCH: 36.8 pg — ABNORMAL HIGH (ref 26.0–34.0)
MCH: 36.9 pg — ABNORMAL HIGH (ref 26.0–34.0)
MCHC: 30.4 g/dL (ref 30.0–36.0)
MCHC: 30.6 g/dL (ref 30.0–36.0)
MCV: 120.9 fL — ABNORMAL HIGH (ref 80.0–100.0)
MCV: 120.5 fL — ABNORMAL HIGH (ref 80.0–100.0)
Platelets: 202 10*3/uL (ref 150–400)
Platelets: 171 10*3/uL (ref 150–400)
RBC: 2.34 MIL/uL — ABNORMAL LOW (ref 3.87–5.11)
RBC: 1.95 MIL/uL — ABNORMAL LOW (ref 3.87–5.11)
RDW: 18.4 % — ABNORMAL HIGH (ref 11.5–15.5)
RDW: 18.6 % — ABNORMAL HIGH (ref 11.5–15.5)
WBC: 10.6 10*3/uL — ABNORMAL HIGH (ref 4.0–10.5)
WBC: 8.1 10*3/uL (ref 4.0–10.5)
nRBC: 0.5 % — ABNORMAL HIGH (ref 0.0–0.2)
nRBC: 0.7 % — ABNORMAL HIGH (ref 0.0–0.2)

## 2020-09-21 LAB — MRSA NEXT GEN BY PCR, NASAL: MRSA by PCR Next Gen: DETECTED — AB

## 2020-09-21 LAB — URINALYSIS, ROUTINE W REFLEX MICROSCOPIC
Bilirubin Urine: NEGATIVE
Glucose, UA: NEGATIVE mg/dL
Hgb urine dipstick: NEGATIVE
Ketones, ur: NEGATIVE mg/dL
Leukocytes,Ua: NEGATIVE
Nitrite: NEGATIVE
Protein, ur: NEGATIVE mg/dL
Specific Gravity, Urine: 1.024 (ref 1.005–1.030)
pH: 5 (ref 5.0–8.0)

## 2020-09-21 LAB — APTT: aPTT: 21 seconds — ABNORMAL LOW (ref 24–36)

## 2020-09-21 LAB — TSH: TSH: 2.769 u[IU]/mL (ref 0.350–4.500)

## 2020-09-21 LAB — POTASSIUM: Potassium: 3.9 mmol/L (ref 3.5–5.1)

## 2020-09-21 LAB — PROTIME-INR
INR: 1 (ref 0.8–1.2)
Prothrombin Time: 13.5 seconds (ref 11.4–15.2)

## 2020-09-21 LAB — RESP PANEL BY RT-PCR (FLU A&B, COVID) ARPGX2
Influenza A by PCR: NEGATIVE
Influenza B by PCR: NEGATIVE
SARS Coronavirus 2 by RT PCR: NEGATIVE

## 2020-09-21 LAB — MAGNESIUM: Magnesium: 1.8 mg/dL (ref 1.7–2.4)

## 2020-09-21 LAB — PHOSPHORUS: Phosphorus: 3.2 mg/dL (ref 2.5–4.6)

## 2020-09-21 MED ORDER — FENTANYL CITRATE (PF) 100 MCG/2ML IJ SOLN
25.0000 ug | INTRAMUSCULAR | Status: DC | PRN
  Administered 2020-09-21: 50 ug via INTRAVENOUS
  Administered 2020-09-21: 25 ug via INTRAVENOUS
  Administered 2020-09-22: 50 ug via INTRAVENOUS
  Administered 2020-09-22: 25 ug via INTRAVENOUS
  Administered 2020-09-22: 100 ug via INTRAVENOUS
  Administered 2020-09-22: 50 ug via INTRAVENOUS
  Administered 2020-09-22: 100 ug via INTRAVENOUS
  Administered 2020-09-22: 50 ug via INTRAVENOUS
  Administered 2020-09-23: 100 ug via INTRAVENOUS
  Administered 2020-09-23 (×2): 50 ug via INTRAVENOUS
  Filled 2020-09-21 (×8): qty 2

## 2020-09-21 MED ORDER — HYDRALAZINE HCL 20 MG/ML IJ SOLN
10.0000 mg | INTRAMUSCULAR | Status: DC | PRN
  Administered 2020-09-23 – 2020-09-27 (×6): 10 mg via INTRAVENOUS
  Filled 2020-09-21 (×6): qty 1

## 2020-09-21 MED ORDER — PANTOPRAZOLE SODIUM 40 MG IV SOLR
40.0000 mg | Freq: Every day | INTRAVENOUS | Status: DC
  Administered 2020-09-21 – 2020-09-23 (×3): 40 mg via INTRAVENOUS
  Filled 2020-09-21 (×3): qty 40

## 2020-09-21 MED ORDER — IOHEXOL 350 MG/ML SOLN
100.0000 mL | Freq: Once | INTRAVENOUS | Status: AC | PRN
  Administered 2020-09-21: 100 mL via INTRAVENOUS

## 2020-09-21 MED ORDER — LACTATED RINGERS IV BOLUS
1000.0000 mL | Freq: Once | INTRAVENOUS | Status: AC
  Administered 2020-09-21: 1000 mL via INTRAVENOUS

## 2020-09-21 MED ORDER — SODIUM ZIRCONIUM CYCLOSILICATE 10 G PO PACK
10.0000 g | PACK | Freq: Once | ORAL | Status: AC
  Administered 2020-09-21: 10 g
  Filled 2020-09-21: qty 1

## 2020-09-21 MED ORDER — NOREPINEPHRINE 4 MG/250ML-% IV SOLN
2.0000 ug/min | INTRAVENOUS | Status: DC
Start: 2020-09-21 — End: 2020-09-24
  Administered 2020-09-21: 5 ug/min via INTRAVENOUS
  Filled 2020-09-21: qty 250

## 2020-09-21 MED ORDER — AMPICILLIN-SULBACTAM SODIUM 1.5 (1-0.5) G IJ SOLR
1.5000 g | Freq: Four times a day (QID) | INTRAMUSCULAR | Status: AC
  Administered 2020-09-21 – 2020-09-25 (×19): 1.5 g via INTRAVENOUS
  Filled 2020-09-21 (×24): qty 4

## 2020-09-21 MED ORDER — LEVOTHYROXINE SODIUM 100 MCG/5ML IV SOLN
50.0000 ug | Freq: Every day | INTRAVENOUS | Status: DC
  Filled 2020-09-21: qty 5

## 2020-09-21 MED ORDER — DOCUSATE SODIUM 50 MG/5ML PO LIQD: 100.0000 mg | Freq: Two times a day (BID) | ORAL | Status: DC | PRN

## 2020-09-21 MED ORDER — POLYETHYLENE GLYCOL 3350 17 G PO PACK: 17.0000 g | PACK | Freq: Every day | ORAL | Status: DC | PRN

## 2020-09-21 MED ORDER — ROCURONIUM BROMIDE 50 MG/5ML IV SOLN
INTRAVENOUS | Status: AC | PRN
  Administered 2020-09-21: 100 mg via INTRAVENOUS

## 2020-09-21 MED ORDER — CHLORHEXIDINE GLUCONATE 0.12% ORAL RINSE (MEDLINE KIT)
15.0000 mL | Freq: Two times a day (BID) | OROMUCOSAL | Status: DC
  Administered 2020-09-21 – 2020-09-27 (×12): 15 mL via OROMUCOSAL

## 2020-09-21 MED ORDER — "THROMBI-PAD 3""X3"" EX PADS"
1.0000 | MEDICATED_PAD | Freq: Once | CUTANEOUS | Status: AC
  Administered 2020-09-21: 1 via TOPICAL
  Filled 2020-09-21: qty 1

## 2020-09-21 MED ORDER — MUPIROCIN 2 % EX OINT
1.0000 "application " | TOPICAL_OINTMENT | Freq: Two times a day (BID) | CUTANEOUS | Status: AC
  Administered 2020-09-21 – 2020-09-25 (×10): 1 via NASAL
  Filled 2020-09-21 (×2): qty 22

## 2020-09-21 MED ORDER — FENTANYL CITRATE (PF) 100 MCG/2ML IJ SOLN
25.0000 ug | INTRAMUSCULAR | Status: AC | PRN
  Administered 2020-09-22 (×3): 25 ug via INTRAVENOUS

## 2020-09-21 MED ORDER — LACTATED RINGERS IV BOLUS
500.0000 mL | Freq: Once | INTRAVENOUS | Status: AC
  Administered 2020-09-21: 500 mL via INTRAVENOUS

## 2020-09-21 MED ORDER — ONDANSETRON HCL 4 MG/2ML IJ SOLN
4.0000 mg | Freq: Four times a day (QID) | INTRAMUSCULAR | Status: DC | PRN
  Administered 2020-09-21 – 2020-09-27 (×7): 4 mg via INTRAVENOUS
  Filled 2020-09-21 (×8): qty 2

## 2020-09-21 MED ORDER — ETOMIDATE 2 MG/ML IV SOLN
INTRAVENOUS | Status: AC | PRN
  Administered 2020-09-21: 20 mg via INTRAVENOUS

## 2020-09-21 MED ORDER — ORAL CARE MOUTH RINSE
15.0000 mL | OROMUCOSAL | Status: DC
  Administered 2020-09-21 – 2020-09-23 (×22): 15 mL via OROMUCOSAL

## 2020-09-21 MED ORDER — SODIUM CHLORIDE 0.9 % IV SOLN: 250.0000 mL | INTRAVENOUS | Status: DC

## 2020-09-21 MED ORDER — NOREPINEPHRINE 4 MG/250ML-% IV SOLN
INTRAVENOUS | Status: AC
  Administered 2020-09-21: 2 ug/min via INTRAVENOUS
  Filled 2020-09-21: qty 250

## 2020-09-21 MED ORDER — LORAZEPAM 2 MG/ML IJ SOLN: 2.0000 mg | INTRAMUSCULAR | Status: DC | PRN

## 2020-09-21 MED ORDER — DEXMEDETOMIDINE HCL IN NACL 400 MCG/100ML IV SOLN
0.0000 ug/kg/h | INTRAVENOUS | Status: DC
  Administered 2020-09-21 – 2020-09-22 (×2): 0.4 ug/kg/h via INTRAVENOUS
  Administered 2020-09-23: 0.3 ug/kg/h via INTRAVENOUS
  Administered 2020-09-24 (×2): 1.2 ug/kg/h via INTRAVENOUS
  Filled 2020-09-21 (×5): qty 100

## 2020-09-21 MED ORDER — CHLORHEXIDINE GLUCONATE CLOTH 2 % EX PADS
6.0000 | MEDICATED_PAD | Freq: Every day | CUTANEOUS | Status: AC
  Administered 2020-09-21 – 2020-09-25 (×6): 6 via TOPICAL

## 2020-09-21 MED ORDER — SODIUM CHLORIDE 0.9% FLUSH
3.0000 mL | Freq: Once | INTRAVENOUS | Status: AC
Start: 2020-09-21 — End: 2020-09-21
  Administered 2020-09-21: 3 mL via INTRAVENOUS

## 2020-09-21 NOTE — Progress Notes (Addendum)
eLink Physician-Brief Progress Note Patient Name: Cynthia Benson DOB: 1941-02-25 MRN: [id number]   Date of Service  09/21/2020  HPI/Events of Note  Multiple issues: 1. Oozing CVL site. 2. Last Lactic acid at 2:59 PM = 5.6. Hgb = 10.2.  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 Monitor CVP now and Q 4 hours.     Intervention Category Major Interventions: Acid-Base disturbance - evaluation and management;Other:  Lysle Dingwall 09/21/2020, 7:54 PM

## 2020-09-21 NOTE — Progress Notes (Signed)
NAME:  Cynthia Benson, MRN:  [id number], DOB:  [birth year], LOS: 0 ADMISSION DATE:  09/21/2020, CONSULTATION DATE: 09/21/2020 REFERRING MD: Dr. Christy Gentles, CHIEF COMPLAINT: Acute encephalopathy  Brief HPI:  Cynthia Benson is a 80 y.o. female with a history of hypertension, hypothyroidism, chronic back pain with narcotic use who presented to the [facility] ED as code stroke following fall.  LKW 9 PM 7/27, was found on the floor around 1 AM after evidence of fall, right periorbital frontal hematoma.  While en route to ED via EMS patient had an episode of emesis, which required urgent intubation.  Etiology of her altered mental status felt to be either due to trauma versus CVA.  Head CT and CT angio head and neck were both unrevealing in the ED.  No reported overt seizure activity.  UDS pending.  Chest x-ray postintubation with a left perihilar infiltrate concerning for aspiration.  Pertinent  Medical History  Hypothyroidism Hypertension Chronic back pain with intermittent opiate use   Significant Hospital Events: Including procedures, antibiotic start and stop dates in addition to other pertinent events   Head CT 7/28 >> no acute abnormality.  Right frontal periorbital scalp contusion without any evidence of calvarial fracture CT-angio head neck 7/28 >> no evidence for large vessel occlusion, perfusion abnormality CT C-spine 7/28 >> no acute C-spine trauma, stable C-spine degeneration since 2019 Chest x-ray 7/28 >> ET tube in position, left perihilar infiltrate  Interim History / Subjective:   Patient lying in bed surrounded by family who are in the room. Patient aware and able to follow commands, however, she is somnolent and tired-appearing.  Patient with emesis x2 since intubation. Has not received sedation post-intubation.  Objective   Blood pressure 116/80, pulse 96, temperature (!) 97.3 F (36.3 C), temperature source Axillary, resp. rate 19, height '5\' 2"'$  (1.575 m),  weight 65.8 kg, SpO2 100 %.    Vent Mode: PRVC FiO2 (%):  [60 %-100 %] 60 % Set Rate:  [22 bmp-26 bmp] 26 bmp Vt Set:  [350 mL-400 mL] 400 mL PEEP:  [5 cmH20] 5 cmH20 Plateau Pressure:  [29 cmH20-35 cmH20] 35 cmH20  No intake or output data in the 24 hours ending 09/21/20 0954 Filed Weights   09/21/20 0907  Weight: 65.8 kg    Examination: General: Tired-appearing elderly woman, laying in bed, ventilated. Able to follow commands and nod response to questions. HENT: Right periorbital and frontal bruising with hematoma.  PERRL. ET tube in place.  OG tube with orange-brown fluid on suction. Lungs: Diffuse, coarse rhonchi throughout all lung fields, bilaterally. Faint basilar wheeze, bilaterally. Cardiovascular: S1, normal. S2, normal. Regular rate, normal rhythm. No murmurs, rubs, or gallops. Abdomen: Nondistended, obese, hypoactive bowel sounds. Extremities: No edema. Patient able to move fingers and toes on command. Scattered ecchymosis. Strength difficult to assess 2/2 patient somnolence.  Neuro: No grimace or interaction to stimulation or pain. Moves all extremities purposefully.   Resolved Hospital Problem list     Assessment & Plan:  Acute encephalopathy Etiology unclear at this time, question mechanical fall with associated trauma.  No clear evidence for CVA or active seizures.  Question subacute seizure activity. Considering infectious etiology.  - Neurology evaluating, appreciate their recommendations. - Minimize any sedating medications, started precedex but patient not tolerating 2/2 hypotension, will hold at the moment. - EEG today (7/28) - MRI brain  Lactic Acidosis Sepsis Patient with lactic acid of 3.5. ABG mildly acidic at 7.365. Blood pressure has been normotensive,  intermittent episodes of hypotension which resolved with LR fluid bolus x2. Unsure etiology, considering sepsis given patient with likely pneumonia given chest xray and respiratory findings on physical  exam. Patient started on Unasyn on presentation for likely pneumonia. Will get blood culture x2, respiratory culture, and trend lactic acid at this time. Patient O2 sats stable, patient on mechanical ventilation at this time. Will continue to monitor vitals, reassuring patient hemodynamically stable, and afebrile. Will follow up cultures and tailor anti-microbial therapy accordingly. Will intiate sepsis resuscitation with IVF and low dose pressors if IVF inadequate. - Trend lactic acid - Sepsis resuscitation: Total LR IVF given 1.5 L. - LR IVF 500 cc bolus x2; intial - Additional 1000 cc bolus; - Blood culture x 2 (post-antibiotic initiation) - Respiratory culture  Acute respiratory failure requiring mechanical ventilation due to altered mental status and impaired airway protection -PRVC 8 cc/kg -Follow ABG for improvement in respiratory acidosis associated with her suppressed mental status -Symptom triggered sedation per PAD protocol -VAP prevention orders  Left pneumonia, presumed aspiration pneumonia.  Witnessed emesis while unconscious prior to arrival. Two additional episodes of emesis early this AM (7/28). Will continue Unasyn. Getting respiratory culture, will adjust antibiotic coverage appropriately. - Continue Unasyn (first dose 7/28) - Respiratory culture - Follow-up chest x-ray  History of hypertension Patient normotensive/intermittently hypotensive so will continue to hold home antihypertensives at this time. - Continue to hold home losartan, amlodipine, Lasix initially.  Add back as blood pressure will tolerate  Hypothyroidism TSH normal -Synthroid 50 mcg IV, convert to enteral when able   Best Practice (right click and "Reselect all SmartList Selections" daily)   Diet/type: NPO DVT prophylaxis: SCD GI prophylaxis: PPI Lines: N/A Foley:  N/A Code Status:  full code Last date of multidisciplinary goals of care discussion [Pending]  Labs   CBC: Recent Labs  Lab  09/21/20 0224 09/21/20 0305 09/21/20 0334 09/21/20 0500 09/21/20 0855  WBC 10.6*  --   --  8.1  --   NEUTROABS 8.1*  --   --   --   --   HGB 8.6* 9.9* 8.2* 7.2* 10.2*  HCT 28.3* 29.0* 24.0* 23.5* 30.0*  MCV 120.9*  --   --  120.5*  --   PLT 202  --   --  171  --     Basic Metabolic Panel: Recent Labs  Lab 09/21/20 0224 09/21/20 0305 09/21/20 0334 09/21/20 0500 09/21/20 0855  NA 141 143 142 139 145  K 5.0 5.0 4.6 5.2* 4.0  CL 114* 114*  --  112*  --   CO2 19*  --   --  22  --   GLUCOSE 176* 177*  --  168*  --   BUN 22 23  --  21  --   CREATININE 0.96 0.80  --  0.85  --   CALCIUM 8.5*  --   --  8.0*  --   MG  --   --   --  1.8  --   PHOS  --   --   --  3.2  --    GFR: Estimated Creatinine Clearance: 47 mL/min (by C-G formula based on SCr of 0.85 mg/dL). Recent Labs  Lab 09/21/20 0224 09/21/20 0421 09/21/20 0500 09/21/20 0736  WBC 10.6*  --  8.1  --   LATICACIDVEN  --  1.6  --  3.5*    Liver Function Tests: Recent Labs  Lab 09/21/20 0224  AST 34  ALT 22  ALKPHOS  149*  BILITOT 0.7  PROT 7.1  ALBUMIN 3.8   No results for input(s): LIPASE, AMYLASE in the last 168 hours. No results for input(s): AMMONIA in the last 168 hours.  ABG    Component Value Date/Time   PHART 7.365 09/21/2020 0855   PCO2ART 38.2 09/21/2020 0855   PO2ART 78 (L) 09/21/2020 0855   HCO3 22.0 09/21/2020 0855   TCO2 23 09/21/2020 0855   ACIDBASEDEF 3.0 (H) 09/21/2020 0855   O2SAT 95.0 09/21/2020 0855     Coagulation Profile: Recent Labs  Lab 09/21/20 0224  INR 1.0    Cardiac Enzymes: No results for input(s): CKTOTAL, CKMB, CKMBINDEX, TROPONINI in the last 168 hours.  HbA1C: No results found for: HGBA1C  CBG: Recent Labs  Lab 09/21/20 0223  GLUCAP 188*    Review of Systems:   Unable to obtain due to MS  Past Medical History:  As above  Surgical History:  Back surgery Cholecystectomy Knee surgery Partial gastrectomy Tonsillectomy and  adenoidectomy  Social History:     Never smoker  Family History:  Her family history is not on file.   Allergies Allergies  Allergen Reactions   Moxifloxacin Rash     Home Medications  Prior to Admission medications   Not on File     Critical care time:     Fredderick Severance, [facility] 09/21/20 1:46 PM

## 2020-09-21 NOTE — ED Provider Notes (Signed)
[facility] EMERGENCY DEPARTMENT Provider Note   CSN: [id number] Arrival date & time: 09/21/20  0217     History Chief Complaint  Patient presents with   Code Stroke   level 5 caveat due to altered mental status Britney Beneke Shamar Holand is a 80 y.o. female.  The history is provided by the EMS personnel. The history is limited by the condition of the patient.  Altered Mental Status Presenting symptoms: unresponsiveness   Severity:  Severe Most recent episode:  Today Timing:  Constant Progression:  Worsening Chronicity:  New Patient presents as a code stroke.  Last known well was at 2100 on July 27.  It reported the patient woke up at 1 AM and was found on the floor by family member.  EMS reports that she had a left-sided facial droop.  However she had signs of head trauma and altered mental status.  In route patient began to vomit and needed assisting with ventilations. No other details known on arrival     PMH-unknown Soc hx - unknown  Patient Active Problem List   Diagnosis Date Noted   Anemia 03/22/2020    Class: Chronic    History reviewed. No pertinent surgical history.   OB History   No obstetric history on file.     No family history on file.     Home Medications Prior to Admission medications   Not on File    Allergies    Patient has no known allergies.  Review of Systems   Review of Systems  Unable to perform ROS: Mental status change   Physical Exam Updated Vital Signs BP (!) 157/63 (BP Location: Left Arm)   Pulse (!) 103   Resp (!) 22   SpO2 100%   Physical Exam CONSTITUTIONAL: Elderly, unresponsive HEAD: Hematoma noted to right forehead EYES: Right periorbital hematoma noted, EOMI/PERRL ENMT: Mucous membranes moist, vomit in mouth CV: S1/S2 noted, no murmurs/rubs/gallops noted LUNGS: Sonorous respirations noted ABDOMEN: soft, nondistended GU: Patient wearing a diaper NEURO: Pt is unresponsive, GCS 8.  Patient is  able to move all extremities to pain EXTREMITIES: pulses normal/equal, full ROM, no deformities, pelvis stable SKIN: warm, color normal PSYCH: Unable to assess ED Results / Procedures / Treatments   Labs (all labs ordered are listed, but only abnormal results are displayed) Labs Reviewed  APTT - Abnormal; Notable for the following components:      Result Value   aPTT 21 (*)    All other components within normal limits  CBC - Abnormal; Notable for the following components:   WBC 10.6 (*)    RBC 2.34 (*)    Hemoglobin 8.6 (*)    HCT 28.3 (*)    MCV 120.9 (*)    MCH 36.8 (*)    RDW 18.4 (*)    nRBC 0.5 (*)    All other components within normal limits  DIFFERENTIAL - Abnormal; Notable for the following components:   Neutro Abs 8.1 (*)    Abs Immature Granulocytes 0.62 (*)    All other components within normal limits  COMPREHENSIVE METABOLIC PANEL - Abnormal; Notable for the following components:   Chloride 114 (*)    CO2 19 (*)    Glucose, Bld 176 (*)    Calcium 8.5 (*)    Alkaline Phosphatase 149 (*)    GFR, Estimated 60 (*)    All other components within normal limits  I-STAT CHEM 8, ED - Abnormal; Notable for the following components:  Chloride 114 (*)    Glucose, Bld 177 (*)    Calcium, Ion 1.10 (*)    TCO2 21 (*)    Hemoglobin 9.9 (*)    HCT 29.0 (*)    All other components within normal limits  CBG MONITORING, ED - Abnormal; Notable for the following components:   Glucose-Capillary 188 (*)    All other components within normal limits  I-STAT ARTERIAL BLOOD GAS, ED - Abnormal; Notable for the following components:   pH, Arterial 7.152 (*)    pCO2 arterial 71.7 (*)    pO2, Arterial 316 (*)    Acid-base deficit 4.0 (*)    HCT 24.0 (*)    Hemoglobin 8.2 (*)    All other components within normal limits  RESP PANEL BY RT-PCR (FLU A&B, COVID) ARPGX2  PROTIME-INR  BLOOD GAS, ARTERIAL    EKG EKG Interpretation  Date/Time:  Thursday September 21 2020 03:32:54  EDT Ventricular Rate:  104 PR Interval:  192 QRS Duration: 104 QT Interval:  339 QTC Calculation: 446 R Axis:   1 Text Interpretation: Sinus tachycardia Anteroseptal infarct, old Abnormal ekg No previous ECGs available Confirmed by Ripley Fraise [phone]) on 09/21/2020 3:45:25 AM  Radiology DG Chest Portable 1 View  Result Date: 09/21/2020 CLINICAL DATA:  Endotracheal tube and orogastric tube placement. EXAM: PORTABLE CHEST 1 VIEW COMPARISON:  Jun 28, 2020 FINDINGS: An endotracheal tube is seen with its distal tip approximately 3.1 cm from the carina. An orogastric tube is noted with its distal end extending into the body of the stomach. Mild, diffusely increased interstitial lung markings are noted. Marked severity left perihilar and left infrahilar infiltrate is seen. There is no evidence of a pleural effusion or pneumothorax. The heart size and mediastinal contours are within normal limits. Both lungs are clear. The visualized skeletal structures are unremarkable. IMPRESSION: 1. Endotracheal tube and orogastric tube positioning, as described above. 2. Marked severity left perihilar and left infrahilar infiltrates. Electronically Signed   By: Virgina Norfolk M.D.   On: 09/21/2020 02:57   CT HEAD CODE STROKE WO CONTRAST  Result Date: 09/21/2020 CLINICAL DATA:  Code stroke. Initial evaluation for acute stroke, left facial droop. EXAM: CT HEAD WITHOUT CONTRAST TECHNIQUE: Contiguous axial images were obtained from the base of the skull through the vertex without intravenous contrast. COMPARISON:  Prior CT from 06/28/2020. FINDINGS: Brain: Generalized age appropriate cerebral atrophy with mild chronic small vessel ischemic disease. No acute intracranial hemorrhage. No acute large vessel territory infarct. No mass lesion, midline shift or mass effect. No hydrocephalus. No visible extra-axial fluid collection. Vascular: No hyperdense vessel. Scattered vascular calcifications noted within the carotid  siphons. Skull: Soft tissue contusion present at the right forehead and periorbital region. No calvarial fracture. Sinuses/Orbits: Globes and orbital soft tissues demonstrate no other acute finding. Scattered mucosal thickening noted within the ethmoidal air cells and left maxillary sinus. Paranasal sinuses are otherwise clear. No mastoid effusion. Other: None. ASPECTS [facility] Stroke Program Early CT Score) - Ganglionic level infarction (caudate, lentiform nuclei, internal capsule, insula, M1-M3 cortex): 7 - Supraganglionic infarction (M4-M6 cortex): 3 Total score (0-10 with 10 being normal): 10 IMPRESSION: 1. No acute intracranial abnormality. 2. ASPECTS is 10. 3. Right frontal/periorbital scalp contusion. No calvarial fracture. These results were communicated to Dr. Rory Percy at 3:03 am on 09/21/2020 by text page via the [facility] messaging system. Electronically Signed   By: Jeannine Boga M.D.   On: 09/21/2020 03:05    Procedures .Critical Care  Date/Time: 09/21/2020  2:52 AM Performed by: Ripley Fraise, MD Authorized by: Ripley Fraise, MD   Critical care provider statement:    Critical care time (minutes):  56   Critical care start time:  09/21/2020 2:52 AM   Critical care end time:  09/21/2020 3:48 AM   Critical care time was exclusive of:  Separately billable procedures and treating other patients   Critical care was necessary to treat or prevent imminent or life-threatening deterioration of the following conditions:  CNS failure or compromise and trauma   Critical care was time spent personally by me on the following activities:  Discussions with consultants, evaluation of patient's response to treatment, examination of patient, re-evaluation of patient's condition, pulse oximetry, ordering and review of radiographic studies, ordering and review of laboratory studies and ordering and performing treatments and interventions   I assumed direction of critical care for this patient from another  provider in my specialty: no     Care discussed with: admitting provider   IO LINE INSERTION  Date/Time: 09/21/2020 2:40 AM Performed by: Ripley Fraise, MD Authorized by: Ripley Fraise, MD   Consent:    Consent obtained:  Emergent situation Pre-procedure details:    Site preparation:  Alcohol   Preparation: Patient was prepped and draped in usual sterile fashion   Anesthesia:    Anesthesia method:  None Procedure details:    Insertion site:  R proximal tibia   Insertion device:  Drill device   Insertion: Needle was inserted through the bony cortex     Number of attempts:  1   Insertion confirmation:  Aspiration of blood/marrow, easy infusion of fluids and stability of the needle Post-procedure details:    Procedure completion:  Tolerated   Medications Ordered in ED Medications  sodium chloride flush (NS) 0.9 % injection 3 mL (3 mLs Intravenous Given 09/21/20 0224)  etomidate (AMIDATE) injection (20 mg Intravenous Given 09/21/20 0227)  rocuronium (ZEMURON) injection (100 mg Intravenous Given 09/21/20 0228)  iohexol (OMNIPAQUE) 350 MG/ML injection 100 mL (100 mLs Intravenous Contrast Given 09/21/20 0331)    ED Course  I have reviewed the triage vital signs and the nursing notes.  Pertinent labs & imaging results that were available during my care of the patient were reviewed by me and considered in my medical decision making (see chart for details).    MDM Rules/Calculators/A&P                           Patient seen on arrival as a code stroke.  Patient had obvious signs of head trauma and had a GCS of 8.  Patient arrived without any IV access or any airway management.  Patient was gurling  on vomit had sonorous respirations.  On arrival, patient was suctioned copiously from her mouth.  Oxygen was applied.  She had no IV access it was very difficult to obtain peripheral IV.  I placed an intraosseous line in the right tibia without difficulty due to emergency need for vascular  access. Patient was intubated without difficulty by APP Differential includes intracranial hemorrhage, ischemic stroke,traumatic hemorrhage. 3:49 AM No acute intracranial hemorrhage.  Per neurology Dr. Rory Percy, no signs of any large vessel occlusion Is now reported the patient is on large doses of narcotics at home.  Patient also has evidence of hypercapnia.  It is possible patient became oversedated causing hypercapnic respiratory failure Discussed with Dr. Lamonte Sakai with critical care for admission BP (!) 157/63 (BP Location: Left Arm)  Pulse (!) 103   Resp (!) 22   SpO2 100%   Final Clinical Impression(s) / ED Diagnoses Final diagnoses:  Acute respiratory failure with hypercapnia [facility])    Rx / DC Orders ED Discharge Orders     None        Ripley Fraise, MD 09/21/20 [phone]

## 2020-09-21 NOTE — ED Provider Notes (Signed)
Procedure Name: Intubation Date/Time: 09/21/2020 2:48 AM Performed by: Abigail Butts, PA-C Pre-anesthesia Checklist: Patient identified, Patient being monitored, Emergency Drugs available, Timeout performed and Suction available Oxygen Delivery Method: Non-rebreather mask Preoxygenation: Pre-oxygenation with 100% oxygen Induction Type: Rapid sequence Ventilation: Mask ventilation without difficulty Laryngoscope Size: Glidescope and 3 Tube size: 7.5 mm Number of attempts: 1 Airway Equipment and Method: Video-laryngoscopy Placement Confirmation: ETT inserted through vocal cords under direct vision, CO2 detector and Breath sounds checked- equal and bilateral Secured at: 23 cm Tube secured with: ETT holder Dental Injury: Teeth and Oropharynx as per pre-operative assessment  Difficulty Due To: Difficulty was anticipated Comments: Large volume emesis in the airway.  Diminished BS on left. Tube withdrawn to 22cm at the lip after x-ray.       Irem Stoneham, Gwenlyn Perking 09/21/20 0249    Ripley Fraise, MD 09/21/20 [phone]

## 2020-09-21 NOTE — Procedures (Signed)
Patient Name: Najaya Holdaway  MRN: [id number]  Epilepsy Attending: Lora Havens  Referring Physician/Provider: Nevada Crane, PA Date: 09/21/2020 Duration: 22.37 mins  Patient history: 80 year old woman found down by family and then noticed to have left facial weakness along with altered mental status and diminished level of consciousness.   Level of alertness: Awake  AEDs during EEG study: None  Technical aspects: This EEG study was done with scalp electrodes positioned according to the 10-20 International system of electrode placement. Electrical activity was acquired at a sampling rate of '500Hz'$  and reviewed with a high frequency filter of '70Hz'$  and a low frequency filter of '1Hz'$ . EEG data were recorded continuously and digitally stored.   Description: The posterior dominant rhythm consists of '8Hz'$  activity of moderate voltage (25-35 uV) seen predominantly in posterior head regions, symmetric and reactive to eye opening and eye closing. EEG showed intermittent generalized 3 to 6 Hz theta-delta slowing.  Patient was noted to have intermittent whole-body jerks. Concomitant EEG before, during and after the event did not show any EEG changes suggest seizure. Hyperventilation and photic stimulation were not performed.     ABNORMALITY - Continuous slow, generalized  IMPRESSION: This study is suggestive of mild diffuse encephalopathy, nonspecific etiology.  Patient was noted to have intermittent whole-body jerks without concomitant EEG change.  These episodes were not epileptic.  No seizures or epileptiform discharges were seen throughout the recording.  Eldena Dede Barbra Sarks

## 2020-09-21 NOTE — Progress Notes (Signed)
[facility] Progress Note Patient Name: Cynthia Benson DOB: [birth year] MRN: [id number]   Date of Service  09/21/2020  HPI/Events of Note  ABG from 3:34 AM = 7.152/71.7/316/25.1. Preintubation? Post Intubation? Ventilator settings?  eICU Interventions  Plan: ABG now.     Intervention Category Major Interventions: Respiratory failure - evaluation and management  Evea Sheek [place] 09/21/2020, 6:49 AM

## 2020-09-21 NOTE — Progress Notes (Signed)
Patient's CVL site was oozing blood at shift change. Day RN reported that it was oozing prior and the dressing had already been changed once. Notified E-Link physician and thrombi-pad was ordered. Thrombi pad and pressure dressing in place. Will continue to monitor CVP and reassess CVL site regularly.

## 2020-09-21 NOTE — Consult Note (Addendum)
Neurology Consultation  Reason for Consult: left facial droop, AMS Referring Physician: Dr Christy Gentles    CC: left facial droop, AMS  History is obtained from:chart, daughter Ms. Shepherd  HPI: Cynthia Benson is a 80 y.o. female past medical history of hypertension, hypothyroidism, chronic back pain on opiates, brought into the emergency room via [facility] with last known well at 9 PM 09/20/2020 and woke up around 1 AM presumably and was found on the floor by family.  She hit her head and has a hematoma to the right side of the head and right eyelid.  EMS was called, they noticed a left-sided facial droop and brought her in as a concern for trauma versus stroke.  She is not on any anticoagulants or antiplatelets.  She did have an episode of vomitus on route and required bag ventilation. No h/o seizures. She was not able to protect her airway and was emergently intubated. My best exam as documented below. Noncontrast head CT was unremarkable Due to no clear explanation of her current presentation, suspicion for a posterior circulation thrombus for which an emergent head and neck CTA as well as CT perfusion study was performed.    LKW: 9 PM on 09/20/2020 tpa given?: no, outside the window EVT candidate: not a candidate due to no eLVO on imaging Premorbid modified Rankin scale (mRS): 2-mostly due to back pain and gait difficulties from chronic back pain Lives independently, performs ADLs independently, walks using a cane  [id number] to obtain due to altered mental status.   No past medical history on file.   No family history on file.   Social History:   has no history on file for tobacco use, alcohol use, and drug use.  Medications  Current Facility-Administered Medications:    ampicillin-sulbactam (UNASYN) 1.5 g in sodium chloride 0.9 % 100 mL IVPB, 1.5 g, Intravenous, Q6H, Byrum, Rose Fillers, MD No current outpatient medications on file.   Exam: Current vital signs: BP  (!) 160/79   Pulse (!) 113   Resp 19   SpO2 100%  Vital signs in last 24 hours: Pulse Rate:  [108-113] 113 (07/28 0232) Resp:  [19-22] 19 (07/28 0232) BP: (160-161)/(79-101) 160/79 (07/28 0230) SpO2:  [95 %-100 %] 100 % (07/28 0232) General: Extremely drowsy, not following commands HEENT: Has a bruise over the right eyebrow, right eyelids CVS: Regular rate rhythm Respiratory: Diminished breath sounds on the left Abdomen nondistended nontender Neurological exam Extremely drowsy, does not follow commands Nonverbal On trying to flush her I/O access, she screamed in pain but did not produce any verbal output that was coherent. She moves both her legs equally to noxious stimulation while the IO was being accessed but did not move them to pinching. Cranial nerves: Pupils are 3 mm very sluggishly reactive bilaterally, does not blink to threat from either side, face appears symmetric. Motor examination: She stimulation she did have equal movement of both her lower extremities and some grimacing on noxious stimulation to both upper extremities. Sensory exam: As above Coordination difficult to assess given her mentation NIH stroke scale 1a Level of Conscious.: 1 1b LOC Questions: 2 1c LOC Commands: 2 2 Best Gaze: 0 3 Visual: 0 4 Facial Palsy: 0 5a Motor Arm - left: 2 5b Motor Arm - Right: 2 6a Motor Leg - Left: 1 6b Motor Leg - Right: 1 7 Limb Ataxia: 0 8 Sensory: 0 9 Best Language: 3 10 Dysarthria: 2 11 Extinct. and Inatten.: 0 TOTAL: 16  Labs I have reviewed labs in epic and the results pertinent to this consultation are: CBC    Component Value Date/Time   WBC 5.2 08/12/2006 1527   RBC 3.76 (L) 08/12/2006 1527   HGB 11.1 (L) 08/12/2006 1527   HCT 32.0 (L) 08/12/2006 1527   PLT 257 08/12/2006 1527   MCV 85.0 08/12/2006 1527   MCHC 34.7 08/12/2006 1527   RDW 14.1 08/12/2006 1527   MONOABS 0.7 08/12/2006 1527   EOSABS 0.2 08/12/2006 1527   BASOSABS 0.0 08/12/2006 1527    CMP     Component Value Date/Time   NA 142 08/12/2006 1527   K 4.4 08/12/2006 1527   CL 102 08/12/2006 1527   CO2 31 08/12/2006 1527   GLUCOSE 95 08/12/2006 1527   BUN 11 08/12/2006 1527   CREATININE 0.8 08/12/2006 1527   CALCIUM 8.5 08/12/2006 1527   GFRNONAA 76 08/12/2006 1527   GFRAA 92 08/12/2006 1527   Imaging I have reviewed the images obtained  CT-head-no acute changes.  Aspects 10.  No bleed.  No calvarial fracture.  Soft tissue contusion of the right forehead and periorbital region. CT perfusion with no perfusion deficit CT angiography head and neck with no emergent LVO.  MRI examination of the brain-recommended  [id number] and tubes in place. Marked left perihilar and infrahilar infiltrates.  Assessment: 80 year old woman found down by family and then noticed to have left facial weakness along with altered mental status and diminished level of consciousness requiring emergent intubation for airway protection. Her noncontrasted head CT only shows soft tissue contusion on the right forehead and periorbital swelling but no intracranial acute process.  CT angiography of the head and neck and CT perfusion study was performed for the concern for posterior circulation involvement which was unremarkable for any acute occlusion. Initially, history was not provided of her being on opiates but later pharmacy obtained records that she is on opiates and the family confirmed. Unclear if she had taken extra medications and had a mechanical fall leading to this or if she truly has an acute stroke-a negative CT and CT angiography and CT perfusion study can only rule out a large stroke with a small stroke could be missed for that an MRI should be performed. CXR concerning for pneumonia - ?aspiration vs bacterial vs viral  Impression: -Altered mental status, unclear etiology- evaluate for systemic infectious process (pneumonia, uti etc) vs opiate overdose -Evaluate for stroke versus toxic  metabolic encephalopathy -No history of and no witnessed seizures - no indication for stat EEG  Recommendations: Admit to ICU MRI of the brain without contrast when able to Check urinalysis and Utox Check COVID-19 PCR Sedation holiday and repeat neurological examinations Consider EEG after follow up exam if still no improvement and no clear cause of presentation identified. Management of pneumonia per ER/CCM Neurology will follow with you.  Plan discussed with Abigail Butts, APP and Dr. Christy Gentles in the ED.  -- Amie Portland, MD Neurologist [facility] Pager: [phone]  CRITICAL CARE ATTESTATION Performed by: Amie Portland, MD Total critical care time: 60 minutes Critical care time was exclusive of separately billable procedures and treating other patients and/or supervising APPs/Residents/Students Critical care was necessary to treat or prevent imminent or life-threatening deterioration due to toxic metabolic encephalopathy, concern for stroke This patient is critically ill and at significant risk for neurological worsening and/or death and care requires constant monitoring. Critical care was time spent personally by me on the following activities: development of treatment plan with  patient and/or surrogate as well as nursing, discussions with consultants, evaluation of patient's response to treatment, examination of patient, obtaining history from patient or surrogate, ordering and performing treatments and interventions, ordering and review of laboratory studies, ordering and review of radiographic studies, pulse oximetry, re-evaluation of patient's condition, participation in multidisciplinary rounds and medical decision making of high complexity in the care of this patient.

## 2020-09-21 NOTE — ED Triage Notes (Signed)
Pt comes via [facility], [facility] at 9pm, woke up at 1am found on the floor after a fall, hit head, hematoma to R side of head, L sided facial droop, not on thinners, pt vomited en route, assisting ventilations.

## 2020-09-21 NOTE — Procedures (Signed)
Central Venous Catheter Insertion Procedure Note  Cynthia Benson  [id number]  Oct 12, 1940  Date:09/21/20  Time:5:46 PM   Provider Performing:Daunte Oestreich Cleon Dew   Procedure: Insertion of Non-tunneled Central Venous [id number] with US guidance [id number])   Indication(s) Medication administration and Difficult access  Consent Risks of the procedure as well as the alternatives and risks of each were explained to the patient and/or caregiver.  Consent for the procedure was obtained and is signed in the bedside chart  Anesthesia Topical only with 1% lidocaine   Timeout Verified patient identification, verified procedure, site/side was marked, verified correct patient position, special equipment/implants available, medications/allergies/relevant history reviewed, required imaging and test results available.  Sterile Technique Maximal sterile technique including full sterile barrier drape, hand hygiene, sterile gown, sterile gloves, mask, hair covering, sterile ultrasound probe cover (if used).  Procedure Description Area of catheter insertion was cleaned with chlorhexidine and draped in sterile fashion.  With real-time ultrasound guidance a central venous catheter was placed into the left internal jugular vein. Nonpulsatile blood flow and easy flushing noted in all ports.  The catheter was sutured in place and sterile dressing applied.     Complications/Tolerance None; patient tolerated the procedure well. Chest X-ray is ordered to verify placement for internal jugular or subclavian cannulation.   Chest x-ray is not ordered for femoral cannulation.  EBL Minimal  Specimen(s) None  Redmond School., MSN, APRN, AGACNP-BC [facility]  09/21/2020 , 5:47 PM  Please see [email or web address] for pager details  If no response, please call [phone] After hours, please call Elink at [phone]

## 2020-09-21 NOTE — Progress Notes (Signed)
   09/21/20 1330  Vitals  BP (!) 83/41  MAP (mmHg) (!) 53   CCM provider notified, order for 1L bolus of LR and Levophed gtt.  Candy Sledge, RN

## 2020-09-21 NOTE — Progress Notes (Signed)
EEG complete - results pending 

## 2020-09-21 NOTE — Progress Notes (Signed)
Critical Lab: Lactic Acid 3.5.  Reported to [place].

## 2020-09-21 NOTE — H&P (Addendum)
NAME:  Cynthia Benson, MRN:  [id number], DOB:  [birth year], LOS: 0 ADMISSION DATE:  09/21/2020, CONSULTATION DATE: 09/21/2020 REFERRING MD: Dr. Christy Gentles, CHIEF COMPLAINT: Acute encephalopathy  History of Present Illness:  80 year old woman with a history of hypertension, hypothyroidism, chronic back pain with narcotic use.  Per family report last known well 9 PM 7/27, was found on the floor by family around 1 AM after evidence for a fall, right periorbital frontal hematoma.  Brought emergently to the ED obtunded, had an episode of emesis, require urgent intubation.  Etiology of her altered mental status felt to be either due to trauma versus CVA.  Head CT and CT angio head and neck were both unrevealing in the ED.  No reported overt seizure activity.  UDS pending.  Chest x-ray postintubation with a left perihilar infiltrate concerning for aspiration.  Pertinent  Medical History  Hypothyroidism Hypertension Chronic back pain with intermittent opiate use   Significant Hospital Events: Including procedures, antibiotic start and stop dates in addition to other pertinent events   Head CT 7/28 >> no acute abnormality.  Right frontal periorbital scalp contusion without any evidence of calvarial fracture CT-angio head neck 7/28 >> no evidence for large vessel occlusion, perfusion abnormality CT C-spine 7/28 >> no acute C-spine trauma, stable C-spine degeneration since 2019 Chest x-ray 7/28 >> ET tube in position, left perihilar infiltrate  Interim History / Subjective:   Has not received any sedation since she was intubated around 2:30 AM.  Unresponsive  Objective   Blood pressure (!) 157/63, pulse (!) 103, resp. rate (!) 22, height '5\' 2"'$  (1.575 m), SpO2 100 %.    Vent Mode: PRVC FiO2 (%):  [60 %-100 %] 60 % Set Rate:  [22 bmp-26 bmp] 26 bmp Vt Set:  [350 mL-400 mL] 400 mL PEEP:  [5 cmH20] 5 cmH20 Plateau Pressure:  [29 cmH20] 29 cmH20  No intake or output data in the 24 hours ending  09/21/20 0418 There were no vitals filed for this visit.  Examination: General: Ill-appearing elderly woman, laying in bed, ventilated HENT: Right periorbital and frontal bruising with final hematoma.  Pupils are equal and react.  ET tube in place.  OG tube with orange fluid on suction Lungs: Coarse rhonchi bilaterally particularly on the left Cardiovascular: Regular, distant, no murmur Abdomen: Nondistended, obese, hypoactive bowel sounds Extremities: No edema Neuro: No grimace or interaction to stimulation or pain.  No spontaneous movement.  No gag or cough.  On presentation she reportedly was moving her extremities to noxious stimuli   Resolved Hospital Problem list     Assessment & Plan:  Acute encephalopathy, etiology unclear, question mechanical fall with associated trauma.  No clear evidence for CVA or active seizures.  Question subacute seizure activity.  No significant metabolic disarray, consider effects of infection (UTI, pneumonia, etc.).  Consider effects of medications >> review Care Everywhere 06/22/20 shows she has been on Xanax, amitriptyline, Lyrica, MS Contin, Percocet, Ambien -Appreciate neurology evaluation -Minimize any sedating medications -EEG, nonurgent, to rule out occult seizure activity -MRI brain -Depending on MRI brain results, likely will need repeat head CT at some interval to ensure no evolving change -Urine drug screen pending  Acute respiratory failure requiring mechanical ventilation due to altered mental status and impaired airway protection -PRVC 8 cc/kg -Follow ABG for improvement in respiratory acidosis associated with her suppressed mental status -Symptom triggered sedation per PAD protocol -VAP prevention orders  Left pneumonia, presumed aspiration pneumonia.  Witnessed emesis while unconscious,  receiving bag mask ventilation -Start Unasyn 7/28 -Respiratory culture if able to obtain -Follow chest x-ray  History of hypertension -Hold home  losartan, amlodipine, Lasix initially.  Add back as blood pressure will tolerate  Hypothyroidism -Synthroid 50 mcg IV, convert to enteral when able -TSH   Best Practice (right click and "Reselect all SmartList Selections" daily)   Diet/type: NPO DVT prophylaxis: SCD GI prophylaxis: PPI Lines: N/A Foley:  N/A Code Status:  full code Last date of multidisciplinary goals of care discussion [Pending]  Labs   CBC: Recent Labs  Lab 09/21/20 0224 09/21/20 0305 09/21/20 0334  WBC 10.6*  --   --   NEUTROABS 8.1*  --   --   HGB 8.6* 9.9* 8.2*  HCT 28.3* 29.0* 24.0*  MCV 120.9*  --   --   PLT 202  --   --     Basic Metabolic Panel: Recent Labs  Lab 09/21/20 0224 09/21/20 0305 09/21/20 0334  NA 141 143 142  K 5.0 5.0 4.6  CL 114* 114*  --   CO2 19*  --   --   GLUCOSE 176* 177*  --   BUN 22 23  --   CREATININE 0.96 0.80  --   CALCIUM 8.5*  --   --    GFR: CrCl cannot be calculated (Unknown ideal weight.). Recent Labs  Lab 09/21/20 0224  WBC 10.6*    Liver Function Tests: Recent Labs  Lab 09/21/20 0224  AST 34  ALT 22  ALKPHOS 149*  BILITOT 0.7  PROT 7.1  ALBUMIN 3.8   No results for input(s): LIPASE, AMYLASE in the last 168 hours. No results for input(s): AMMONIA in the last 168 hours.  ABG    Component Value Date/Time   PHART 7.152 (LL) 09/21/2020 0334   PCO2ART 71.7 (HH) 09/21/2020 0334   PO2ART 316 (H) 09/21/2020 0334   HCO3 25.1 09/21/2020 0334   TCO2 27 09/21/2020 0334   ACIDBASEDEF 4.0 (H) 09/21/2020 0334   O2SAT 100.0 09/21/2020 0334     Coagulation Profile: Recent Labs  Lab 09/21/20 0224  INR 1.0    Cardiac Enzymes: No results for input(s): CKTOTAL, CKMB, CKMBINDEX, TROPONINI in the last 168 hours.  HbA1C: No results found for: HGBA1C  CBG: Recent Labs  Lab 09/21/20 0223  GLUCAP 188*    Review of Systems:   Unable to obtain due to MS  Past Medical History:  As above  Surgical History:  Back  surgery Cholecystectomy Knee surgery Partial gastrectomy Tonsillectomy and adenoidectomy  Social History:     Never smoker  Family History:  Her family history is not on file.   Allergies Allergies  Allergen Reactions   Moxifloxacin Rash     Home Medications  Prior to Admission medications   Not on File     Critical care time: 50 minutes     Baltazar Apo, MD, PhD 09/21/2020, 4:44 AM [facility] [phone] or if no answer before 7:00PM call [phone] For any issues after 7:00PM please call eLink [phone]

## 2020-09-21 NOTE — Progress Notes (Signed)
eLink Physician-Brief Progress Note Patient Name: Cynthia Benson DOB: [birth year] MRN: [id number]   Date of Service  09/21/2020  HPI/Events of Note  CVP = 13.  eICU Interventions  Plan: Repeat Lactic Acid now.     Intervention Category Major Interventions: Acid-Base disturbance - evaluation and management  Renan Danese Eugene 09/21/2020, 11:14 PM

## 2020-09-22 ENCOUNTER — Inpatient Hospital Stay (HOSPITAL_COMMUNITY): Payer: Medicare Other

## 2020-09-22 DIAGNOSIS — J9601 Acute respiratory failure with hypoxia: Secondary | ICD-10-CM | POA: Diagnosis not present

## 2020-09-22 DIAGNOSIS — G9341 Metabolic encephalopathy: Secondary | ICD-10-CM

## 2020-09-22 DIAGNOSIS — R579 Shock, unspecified: Secondary | ICD-10-CM | POA: Diagnosis not present

## 2020-09-22 DIAGNOSIS — D62 Acute posthemorrhagic anemia: Secondary | ICD-10-CM

## 2020-09-22 LAB — COMPREHENSIVE METABOLIC PANEL
ALT: 37 U/L (ref 0–44)
AST: 49 U/L — ABNORMAL HIGH (ref 15–41)
Albumin: 2.3 g/dL — ABNORMAL LOW (ref 3.5–5.0)
Alkaline Phosphatase: 97 U/L (ref 38–126)
Anion gap: 7 (ref 5–15)
BUN: 28 mg/dL — ABNORMAL HIGH (ref 8–23)
CO2: 24 mmol/L (ref 22–32)
Calcium: 7.1 mg/dL — ABNORMAL LOW (ref 8.9–10.3)
Chloride: 111 mmol/L (ref 98–111)
Creatinine, Ser: 1.13 mg/dL — ABNORMAL HIGH (ref 0.44–1.00)
GFR, Estimated: 49 mL/min — ABNORMAL LOW (ref >60)
Glucose, Bld: 103 mg/dL — ABNORMAL HIGH (ref 70–99)
Potassium: 3.3 mmol/L — ABNORMAL LOW (ref 3.5–5.1)
Sodium: 142 mmol/L (ref 135–145)
Total Bilirubin: 1.1 mg/dL (ref 0.3–1.2)
Total Protein: 4.9 g/dL — ABNORMAL LOW (ref 6.5–8.1)

## 2020-09-22 LAB — PROCALCITONIN: Procalcitonin: 13.14 ng/mL

## 2020-09-22 LAB — ABO/RH: ABO/RH(D): O POS

## 2020-09-22 LAB — CBC
HCT: 19.8 % — ABNORMAL LOW (ref 36.0–46.0)
HCT: 22.8 % — ABNORMAL LOW (ref 36.0–46.0)
Hemoglobin: 6.3 g/dL — CL (ref 12.0–15.0)
Hemoglobin: 7.4 g/dL — ABNORMAL LOW (ref 12.0–15.0)
MCH: 36.2 pg — ABNORMAL HIGH (ref 26.0–34.0)
MCH: 35.1 pg — ABNORMAL HIGH (ref 26.0–34.0)
MCHC: 31.8 g/dL (ref 30.0–36.0)
MCHC: 32.5 g/dL (ref 30.0–36.0)
MCV: 113.8 fL — ABNORMAL HIGH (ref 80.0–100.0)
MCV: 108.1 fL — ABNORMAL HIGH (ref 80.0–100.0)
Platelets: 138 10*3/uL — ABNORMAL LOW (ref 150–400)
Platelets: 147 10*3/uL — ABNORMAL LOW (ref 150–400)
RBC: 1.74 MIL/uL — ABNORMAL LOW (ref 3.87–5.11)
RBC: 2.11 MIL/uL — ABNORMAL LOW (ref 3.87–5.11)
RDW: 19.1 % — ABNORMAL HIGH (ref 11.5–15.5)
RDW: 21.5 % — ABNORMAL HIGH (ref 11.5–15.5)
WBC: 13.2 10*3/uL — ABNORMAL HIGH (ref 4.0–10.5)
WBC: 13.4 10*3/uL — ABNORMAL HIGH (ref 4.0–10.5)
nRBC: 0 % (ref 0.0–0.2)
nRBC: 0 % (ref 0.0–0.2)

## 2020-09-22 LAB — URINE CULTURE: Culture: NO GROWTH

## 2020-09-22 LAB — PHOSPHORUS: Phosphorus: 2.9 mg/dL (ref 2.5–4.6)

## 2020-09-22 LAB — GLUCOSE, CAPILLARY
Glucose-Capillary: 124 mg/dL — ABNORMAL HIGH (ref 70–99)
Glucose-Capillary: 137 mg/dL — ABNORMAL HIGH (ref 70–99)

## 2020-09-22 LAB — PREPARE RBC (CROSSMATCH)

## 2020-09-22 LAB — MAGNESIUM: Magnesium: 2.2 mg/dL (ref 1.7–2.4)

## 2020-09-22 MED ORDER — MAGNESIUM SULFATE 2 GM/50ML IV SOLN
2.0000 g | Freq: Once | INTRAVENOUS | Status: AC
  Administered 2020-09-22: 2 g via INTRAVENOUS
  Filled 2020-09-22: qty 50

## 2020-09-22 MED ORDER — CALCIUM GLUCONATE-NACL 1-0.675 GM/50ML-% IV SOLN
1.0000 g | Freq: Once | INTRAVENOUS | Status: AC
  Administered 2020-09-22: 1000 mg via INTRAVENOUS
  Filled 2020-09-22: qty 50

## 2020-09-22 MED ORDER — SODIUM CHLORIDE 0.9% IV SOLUTION: Freq: Once | INTRAVENOUS | Status: AC

## 2020-09-22 MED ORDER — POTASSIUM CHLORIDE 20 MEQ PO PACK
20.0000 meq | PACK | Freq: Once | ORAL | Status: AC
  Administered 2020-09-22: 20 meq
  Filled 2020-09-22: qty 1

## 2020-09-22 MED ORDER — OSMOLITE 1.2 CAL PO LIQD
1000.0000 mL | ORAL | Status: DC
  Administered 2020-09-22: 1000 mL

## 2020-09-22 MED ORDER — PROSOURCE TF PO LIQD
45.0000 mL | Freq: Three times a day (TID) | ORAL | Status: DC
  Administered 2020-09-22: 45 mL
  Filled 2020-09-22: qty 45

## 2020-09-22 NOTE — Progress Notes (Signed)
Initial Nutrition Assessment  DOCUMENTATION CODES:   Not applicable  INTERVENTION:   Initiate tube feeding via OG tube: Osmolite 1.2 at 25 ml/h and increase by 10 ml every 8 hours to goal rate of 55 ml/h (1320 ml per day) Prosource TF 45 ml TID  Provides 1704 kcal, 106 gm protein, 1070 ml free water daily    NUTRITION DIAGNOSIS:   Inadequate oral intake related to inability to eat as evidenced by NPO status.  GOAL:   Patient will meet greater than or equal to 90% of their needs  MONITOR:   TF tolerance  REASON FOR ASSESSMENT:   Ventilator, Consult Enteral/tube feeding initiation and management  ASSESSMENT:   Pt with PMH of HTN, chronic back pain with intermittent opiate use, and hypothyroidism admitted after being found down with R periorbital frontal hematoma.   Pt discussed during ICU rounds and with RN.  Pt with emesis and intubated with signs of aspiration PNA.  Per MD ok to start TF and advance  Patient is currently intubated on ventilator support MV: 10.9 L/min Temp (24hrs), Avg:99.7 F (37.6 C), Min:98.3 F (36.8 C), Max:102 F (38.9 C)   Medications reviewed and include: protonix Precedex Labs reviewed: K: 3.3   16 F OG tube; distal stomach vs proximal duodenum  NUTRITION - FOCUSED PHYSICAL EXAM:  Flowsheet Row Most Recent Value  Orbital Region No depletion  Upper Arm Region No depletion  Thoracic and Lumbar Region No depletion  Buccal Region Unable to assess  Temple Region Mild depletion  Clavicle Bone Region Mild depletion  Clavicle and Acromion Bone Region Moderate depletion  Scapular Bone Region Unable to assess  Dorsal Hand Unable to assess  Patellar Region No depletion  Anterior Thigh Region No depletion  Posterior Calf Region No depletion  Edema (RD Assessment) Mild  Hair Reviewed  Eyes Reviewed  Mouth Unable to assess  Skin Reviewed  Nails Unable to assess       Diet Order:   Diet Order             Diet NPO time  specified  Diet effective now                   EDUCATION NEEDS:   Not appropriate for education at this time  Skin:  Skin Assessment:  (MASD: buttocks)  Last BM:  unknown  Height:   Ht Readings from Last 1 Encounters:  09/21/20 '5\' 2"'$  (1.575 m)    Weight:   Wt Readings from Last 1 Encounters:  09/22/20 66.9 kg    BMI:  Body mass index is 26.98 kg/m.  Estimated Nutritional Needs:   Kcal:  1600-1800  Protein:  100-115 grams  Fluid:  > 1.6 L/day  Lockie Pares., RD, LDN, CNSC See AMiON for contact information

## 2020-09-22 NOTE — Progress Notes (Signed)
PCCM Progress Note  Notified by nurse of critical lab value of 6.3, there was reported oozing from CVL site overnight but no other signs of bleeding.   Will transfuse 1 unit of PRBC now and reassess hgb later today.   Shanyla Marconi D. Kenton Kingfisher, NP-C [facility] Personal contact information can be found on Amion  09/22/2020, 9:49 AM

## 2020-09-22 NOTE — Progress Notes (Addendum)
NAME:  Cynthia Benson, MRN:  [id number], DOB:  1940/12/29, LOS: 1 ADMISSION DATE:  09/21/2020, CONSULTATION DATE: 09/21/2020 REFERRING MD: Dr. Christy Gentles, CHIEF COMPLAINT: Acute encephalopathy  Brief HPI:  Cynthia Benson is a 80 y.o. female with a history of hypertension, hypothyroidism, chronic back pain with narcotic use who presented to the [facility] ED as code stroke following fall.  LKW 9 PM 7/27, was found on the floor around 1 AM after evidence of fall, right periorbital frontal hematoma.  While en route to ED via EMS patient had an episode of emesis, which required urgent intubation.  Etiology of her altered mental status felt to be either due to trauma versus CVA.  Head CT and CT angio head and neck were both unrevealing in the ED.  No reported overt seizure activity.  UDS pending.  Chest x-ray postintubation with a left perihilar infiltrate concerning for aspiration.  Pertinent  Medical History  Hypothyroidism Hypertension Chronic back pain with intermittent opiate use   Significant Hospital Events: Including procedures, antibiotic start and stop dates in addition to other pertinent events   Head CT 7/28 >> no acute abnormality.  Right frontal periorbital scalp contusion without any evidence of calvarial fracture CT-angio head neck 7/28 >> no evidence for large vessel occlusion, perfusion abnormality CT C-spine 7/28 >> no acute C-spine trauma, stable C-spine degeneration since 2019 Chest x-ray 7/28 >> ET tube in position, left perihilar infiltrate  Interim History / Subjective:  Patient tired-appearing, but awake and able to follow commands. Patient able to move extremities spontaneously. Had central line placed last ON. Per chart, note oozing and dressing saturated and replaced from central line placement. Blood no longer oozing from central line placement, bandage in place, clean, and not saturated.    Objective   Blood pressure 140/63, pulse 95, temperature  98.5 F (36.9 C), temperature source Axillary, resp. rate (!) 26, height '5\' 2"'$  (1.575 m), weight 66.9 kg, SpO2 94 %. CVP:  [7 mmHg-14 mmHg] 8 mmHg  Vent Mode: PRVC FiO2 (%):  [30 %-60 %] 30 % Set Rate:  [26 bmp] 26 bmp Vt Set:  [400 mL] 400 mL PEEP:  [5 cmH20] 5 cmH20 Plateau Pressure:  [18 cmH20-23 cmH20] 21 cmH20   Intake/Output Summary (Last 24 hours) at 09/22/2020 1143 Last data filed at 09/22/2020 0700 Gross per 24 hour  Intake 1349.44 ml  Output 2050 ml  Net -700.56 ml   Filed Weights   09/21/20 0907 09/22/20 0255  Weight: 65.8 kg 66.9 kg    Examination: General: Tired-appearing elderly woman, laying in bed, ventilated. Able to follow commands, nod, and mouth words in response to questions. HENT: Right periorbital and frontal bruising with hematoma.  PERRL. ET tube in place.  OG tube with thin brown fluid on suction. Lungs: diffuse faint end expiratory wheeze, bilaterally. No rhonchi. No crackles. Patient's vent switched to SBT while in the room. Cardiovascular: S1, normal. S2, normal. Regular rate, normal rhythm. No murmurs, rubs, or gallops. Abdomen: Nondistended, obese, hypoactive bowel sounds. Nontender. Extremities: No edema. Patient able to move fingers and toes on command. Scattered ecchymosis. Neuro: No grimace or interaction to stimulation or pain. Moves all extremities purposefully.   Labs: K 3.3 Lactic acid 3.5 -> 5.6 -> 2.3 Hgb 10.2 -> 6.3; MCV 113 WBC 8.1 -> 13.2 Procalcitonin 13.14 UA unremarkable Respiratory culture - gram positive cocci, few gram negative rods; culture pending Blood Cx x2 pending (post-antibiotics)    Resolved Hospital Problem list  Assessment & Plan:  Neuro (& Psych): Acute metabolic encephalopathy unclear etiology. CT and CTA head and neck without evidence of large vessel occlusion. Patient with fall and right periorbital edema prior to presentation. Trauma could be cause of initial mental status. Additionally, considering  polypharmacy at this time. Outpatient, patient is on Beer's list medications that can increase risk of AMS and falls, such as xanax and percocet. Will hold these medications at this time. Continuing to monitor patients neuro status. Infectious etiology also considered at this time. Overall, patient improving. Alert, able to follow commands with minimal sedation. Hopefully, will continue to improve as vent weaned with goal of extubation. No clinical signs of seizure activity, EEG unremarkable. Neurology following, recommendations appreciated. - minimize sedation medications  Cardiovascular: Patient hypotensive 7/28, with signs concerning for sepsis. Especially considering AMS presentation and chest xray findings c/w aspiration pneumonia. Patient's hypotension improved with 1500 cc LR IVF bolus and pressor (levophed) support. Patient's BP has remained stable ON and this am. Held levophed this am to assess patients BP, currently normotensive. Will continue to assess. Reassuring patient hemodynamically stable. - Continue to monitor BP - Restart levophed if patient hypotensive - Continue to hold home losartan, amlodipine, lasix.   Pulm / Resp: Patient initially required emergent intubation 2/2 emesis en route to ED. Patient 02 sats stable. Attempted to wean this am on SBT. Patient able to tolerate 1.5 hours well. Chest xray on admission with signs of aspiration pneumonia. Lung exam improved significantly. Clinically, patient improving well. Will continue to wean and assess respiratory status.   Gastrointestinal: Patient requiring OG tube 2/2 recurrent emesis will on ventilator. OG tube on suction with thin brown fluid. Patient currently NPO, will keep NPO at the moment. Will consider coretrak placement Monday.  Hematologic: Patient with Hgb 6.3. Notified by nurse and currently transfusing 1 unit pRBC. Will recheck CBC after. Patient with central line placement and blood oozing from site ON. No signs of  active bleeding at this time. - Transfuse 1 unit pRBC - Repeat H/H post transfusion  Infectious Disease: Patient with hypotension, AMS, and chest xray c/w aspiration pneumonia concerning for sepsis. Patient started on Unasyn 7/28. Procalcitonin 13.1 indicating inflammatory process, such as respiratory infection present. Respiratory sputum gram stain with gram positive cocci and few gram negative rods; awaiting culture. Blood culture x2 (post-antibiotic initiation) pending. Will continue with current therapy and tailor antibiotics based on culture results. - F/u pending labs: blood culture x2, respiratory culture - Continue Unasyn (first dose 7/28)   Hypokalemia: 3.3. Will replete today (7/29), recheck tomorrow (7/30) Hypomagnesemia: 1.8. Replete today (7/29), recheck tomorrow (7/30)    Hypothyroidism TSH normal -Synthroid 50 mcg IV, convert to enteral when able   Best Practice (right click and "Reselect all SmartList Selections" daily)   Diet/type: NPO DVT prophylaxis: SCD GI prophylaxis: PPI Lines: N/A Foley:  N/A Code Status:  full code Last date of multidisciplinary goals of care discussion [Pending]  Attending attestation:  Failed SBT for low tidal volumes after an hour. Probably needs more time. Circulatory shock is resolving, will wean norepinephrine. Sent procalcitonin - elevated, continue unasyn for now for bacterial aspiration pneumonia. Holding sedation, can do prn fentanyl or precedex if needed. Acute blood loss anemia possibly secondary to iatrogenic lossess from CVC placement. Transfuse 1 unit prbc and recheck.   The patient is critically ill due to respiratory failure, encephalopathy.  Critical care was necessary to treat or prevent imminent or life-threatening deterioration.  Critical care was time spent  personally by me on the following activities: development of treatment plan with patient and/or surrogate as well as nursing, discussions with consultants, evaluation  of patient's response to treatment, examination of patient, obtaining history from patient or surrogate, ordering and performing treatments and interventions, ordering and review of laboratory studies, ordering and review of radiographic studies, pulse oximetry, re-evaluation of patient's condition and participation in multidisciplinary rounds.   Critical Care Time devoted to patient care services described in this note is 34 minutes. This time reflects time of care of this [place] . This critical care time does not reflect separately billable procedures or procedure time, teaching time or supervisory time of PA/NP/Med student/Med Resident etc but could involve care discussion time.       Spero Geralds [place] [facility] 09/22/2020 2:16 PM  Pager: see AMION  If no response to pager , please call critical care on call (see AMION) until 7pm After 7:00 pm call Elink     Labs   CBC: Recent Labs  Lab 09/21/20 0224 09/21/20 0305 09/21/20 0334 09/21/20 0500 09/21/20 0855 09/22/20 0833  WBC 10.6*  --   --  8.1  --  13.2*  NEUTROABS 8.1*  --   --   --   --   --   HGB 8.6* 9.9* 8.2* 7.2* 10.2* 6.3*  HCT 28.3* 29.0* 24.0* 23.5* 30.0* 19.8*  MCV 120.9*  --   --  120.5*  --  113.8*  PLT 202  --   --  171  --  138*    Basic Metabolic Panel: Recent Labs  Lab 09/21/20 0224 09/21/20 0305 09/21/20 0334 09/21/20 0500 09/21/20 0855 09/21/20 2123 09/22/20 0833  NA 141 143 142 139 145  --  142  K 5.0 5.0 4.6 5.2* 4.0 3.9 3.3*  CL 114* 114*  --  112*  --   --  111  CO2 19*  --   --  22  --   --  24  GLUCOSE 176* 177*  --  168*  --   --  103*  BUN 22 23  --  21  --   --  28*  CREATININE 0.96 0.80  --  0.85  --   --  1.13*  CALCIUM 8.5*  --   --  8.0*  --   --  7.1*  MG  --   --   --  1.8  --   --   --   PHOS  --   --   --  3.2  --   --   --    GFR: Estimated Creatinine Clearance: 35.6 mL/min (A) (by C-G formula based on SCr of 1.13 mg/dL  (H)). Recent Labs  Lab 09/21/20 0224 09/21/20 0421 09/21/20 0500 09/21/20 0736 09/21/20 1121 09/21/20 1459 09/21/20 2323 09/22/20 0833  PROCALCITON  --   --   --   --   --   --   --  13.14  WBC 10.6*  --  8.1  --   --   --   --  13.2*  LATICACIDVEN  --    < >  --  3.5* 3.5* 5.6* 2.3*  --    < > = values in this interval not displayed.    Liver Function Tests: Recent Labs  Lab 09/21/20 0224 09/22/20 0833  AST 34 49*  ALT 22 37  ALKPHOS 149* 97  BILITOT 0.7 1.1  PROT 7.1 4.9*  ALBUMIN  3.8 2.3*   No results for input(s): LIPASE, AMYLASE in the last 168 hours. No results for input(s): AMMONIA in the last 168 hours.  ABG    Component Value Date/Time   PHART 7.365 09/21/2020 0855   PCO2ART 38.2 09/21/2020 0855   PO2ART 78 (L) 09/21/2020 0855   HCO3 22.0 09/21/2020 0855   TCO2 23 09/21/2020 0855   ACIDBASEDEF 3.0 (H) 09/21/2020 0855   O2SAT 95.0 09/21/2020 0855     Coagulation Profile: Recent Labs  Lab 09/21/20 0224  INR 1.0    Cardiac Enzymes: No results for input(s): CKTOTAL, CKMB, CKMBINDEX, TROPONINI in the last 168 hours.  HbA1C: No results found for: HGBA1C  CBG: Recent Labs  Lab 09/21/20 0223  GLUCAP 188*    Review of Systems:   Unable to obtain due to MS  Past Medical History:  As above  Surgical History:  Back surgery Cholecystectomy Knee surgery Partial gastrectomy Tonsillectomy and adenoidectomy  Social History:     Never smoker  Family History:  Her family history is not on file.   Allergies Allergies  Allergen Reactions   Moxifloxacin Rash     Home Medications  Prior to Admission medications   Not on File     Critical care time:     Fredderick Severance, [facility] 09/22/20 11:43 AM

## 2020-09-23 DIAGNOSIS — R579 Shock, unspecified: Secondary | ICD-10-CM | POA: Diagnosis not present

## 2020-09-23 DIAGNOSIS — J9601 Acute respiratory failure with hypoxia: Secondary | ICD-10-CM | POA: Diagnosis not present

## 2020-09-23 DIAGNOSIS — E876 Hypokalemia: Secondary | ICD-10-CM

## 2020-09-23 DIAGNOSIS — G9341 Metabolic encephalopathy: Secondary | ICD-10-CM | POA: Diagnosis not present

## 2020-09-23 LAB — BASIC METABOLIC PANEL
Anion gap: 7 (ref 5–15)
Anion gap: 6 (ref 5–15)
BUN: 31 mg/dL — ABNORMAL HIGH (ref 8–23)
BUN: 27 mg/dL — ABNORMAL HIGH (ref 8–23)
CO2: 20 mmol/L — ABNORMAL LOW (ref 22–32)
CO2: 21 mmol/L — ABNORMAL LOW (ref 22–32)
Calcium: 7.1 mg/dL — ABNORMAL LOW (ref 8.9–10.3)
Calcium: 7.6 mg/dL — ABNORMAL LOW (ref 8.9–10.3)
Chloride: 109 mmol/L (ref 98–111)
Chloride: 112 mmol/L — ABNORMAL HIGH (ref 98–111)
Creatinine, Ser: 1.01 mg/dL — ABNORMAL HIGH (ref 0.44–1.00)
Creatinine, Ser: 0.94 mg/dL (ref 0.44–1.00)
GFR, Estimated: 56 mL/min — ABNORMAL LOW (ref >60)
GFR, Estimated: >60 mL/min (ref >60)
Glucose, Bld: 152 mg/dL — ABNORMAL HIGH (ref 70–99)
Glucose, Bld: 119 mg/dL — ABNORMAL HIGH (ref 70–99)
Potassium: 2.9 mmol/L — ABNORMAL LOW (ref 3.5–5.1)
Potassium: 4.3 mmol/L (ref 3.5–5.1)
Sodium: 136 mmol/L (ref 135–145)
Sodium: 139 mmol/L (ref 135–145)

## 2020-09-23 LAB — GLUCOSE, CAPILLARY
Glucose-Capillary: 157 mg/dL — ABNORMAL HIGH (ref 70–99)
Glucose-Capillary: 127 mg/dL — ABNORMAL HIGH (ref 70–99)
Glucose-Capillary: 121 mg/dL — ABNORMAL HIGH (ref 70–99)
Glucose-Capillary: 110 mg/dL — ABNORMAL HIGH (ref 70–99)
Glucose-Capillary: 126 mg/dL — ABNORMAL HIGH (ref 70–99)
Glucose-Capillary: 114 mg/dL — ABNORMAL HIGH (ref 70–99)

## 2020-09-23 LAB — CULTURE, RESPIRATORY W GRAM STAIN: Culture: NORMAL

## 2020-09-23 LAB — TYPE AND SCREEN
ABO/RH(D): O POS
Antibody Screen: NEGATIVE
Unit division: 0

## 2020-09-23 LAB — CBC
HCT: 22 % — ABNORMAL LOW (ref 36.0–46.0)
Hemoglobin: 7 g/dL — ABNORMAL LOW (ref 12.0–15.0)
MCH: 34.5 pg — ABNORMAL HIGH (ref 26.0–34.0)
MCHC: 31.8 g/dL (ref 30.0–36.0)
MCV: 108.4 fL — ABNORMAL HIGH (ref 80.0–100.0)
Platelets: 136 10*3/uL — ABNORMAL LOW (ref 150–400)
RBC: 2.03 MIL/uL — ABNORMAL LOW (ref 3.87–5.11)
RDW: 21.4 % — ABNORMAL HIGH (ref 11.5–15.5)
WBC: 11.1 10*3/uL — ABNORMAL HIGH (ref 4.0–10.5)
nRBC: 0 % (ref 0.0–0.2)

## 2020-09-23 LAB — BPAM RBC
Blood Product Expiration Date: 202208272359
ISSUE DATE / TIME: 202207291123
Unit Type and Rh: 5100

## 2020-09-23 LAB — MAGNESIUM
Magnesium: 2.2 mg/dL (ref 1.7–2.4)
Magnesium: 2.1 mg/dL (ref 1.7–2.4)

## 2020-09-23 LAB — PHOSPHORUS
Phosphorus: 2.4 mg/dL — ABNORMAL LOW (ref 2.5–4.6)
Phosphorus: 2.8 mg/dL (ref 2.5–4.6)

## 2020-09-23 MED ORDER — POTASSIUM CHLORIDE 10 MEQ/50ML IV SOLN
10.0000 meq | INTRAVENOUS | Status: AC
  Administered 2020-09-23 (×4): 10 meq via INTRAVENOUS
  Filled 2020-09-23 (×4): qty 50

## 2020-09-23 MED ORDER — ACETAMINOPHEN 325 MG PO TABS
650.0000 mg | ORAL_TABLET | Freq: Four times a day (QID) | ORAL | Status: DC | PRN
  Administered 2020-09-23 – 2020-09-25 (×5): 650 mg via ORAL
  Filled 2020-09-23 (×6): qty 2

## 2020-09-23 MED ORDER — POTASSIUM CHLORIDE 20 MEQ PO PACK
40.0000 meq | PACK | Freq: Once | ORAL | Status: AC
  Administered 2020-09-23: 40 meq
  Filled 2020-09-23: qty 2

## 2020-09-23 MED ORDER — POTASSIUM PHOSPHATES 15 MMOLE/5ML IV SOLN
15.0000 mmol | Freq: Once | INTRAVENOUS | Status: AC
  Administered 2020-09-23: 15 mmol via INTRAVENOUS
  Filled 2020-09-23: qty 5

## 2020-09-23 NOTE — Progress Notes (Signed)
EEG completed Thursday showed generalized continuous slowing, suggestive of mild diffuse encephalopathy, nonspecific to etiology. Patient was noted to have intermittent whole-body jerks without concomitant EEG change.  These episodes were not epileptic.  No seizures or epileptiform discharges were seen throughout the recording.  Per notes, the patient is clinically improving.   Neurology will follow on a PRN basis. Please call if the patient experiences any neurological changes.   Electronically signed: Dr. Kerney Elbe

## 2020-09-23 NOTE — Progress Notes (Signed)
NAME:  Benecia Aston, MRN:  [id number], DOB:  Dec 24, 1940, LOS: 2 ADMISSION DATE:  09/21/2020, CONSULTATION DATE: 09/21/2020 REFERRING MD: Dr. Christy Gentles, CHIEF COMPLAINT: Acute encephalopathy  Brief HPI:  Halyn Vamos is a 80 y.o. female with a history of hypertension, hypothyroidism, chronic back pain with narcotic use who presented to the [facility] ED as code stroke following fall.  LKW 9 PM 7/27, was found on the floor around 1 AM after evidence of fall, right periorbital frontal hematoma.  While en route to ED via EMS patient had an episode of emesis, which required urgent intubation.  Etiology of her altered mental status felt to be either due to trauma versus CVA.  Head CT and CT angio head and neck were both unrevealing in the ED.  No reported overt seizure activity.  UDS pending.  Chest x-ray postintubation with a left perihilar infiltrate concerning for aspiration.  Pertinent  Medical History  Hypothyroidism Hypertension Chronic back pain with intermittent opiate use   Significant Hospital Events: Including procedures, antibiotic start and stop dates in addition to other pertinent events   Head CT 7/28 >> no acute abnormality.  Right frontal periorbital scalp contusion without any evidence of calvarial fracture CT-angio head neck 7/28 >> no evidence for large vessel occlusion, perfusion abnormality CT C-spine 7/28 >> no acute C-spine trauma, stable C-spine degeneration since 2019 Chest x-ray 7/28 >> ET tube in position, left perihilar infiltrate 7/29 failed SBT for apnea  Interim History / Subjective:  No overnight issues. Awake and alert this morning, wants tube out, passing SBT.   Objective   Blood pressure (!) 132/58, pulse 75, temperature 98.7 F (37.1 C), temperature source Oral, resp. rate (!) 27, height '5\' 2"'$  (1.575 m), weight 70.4 kg, SpO2 100 %. CVP:  [11 mmHg-17 mmHg] 17 mmHg  Vent Mode: PSV;CPAP FiO2 (%):  [30 %] 30 % Set Rate:  [26 bmp] 26  bmp Vt Set:  [400 mL] 400 mL PEEP:  [5 cmH20] 5 cmH20 Pressure Support:  [5 cmH20] 5 cmH20 Plateau Pressure:  [18 cmH20-21 cmH20] 19 cmH20   Intake/Output Summary (Last 24 hours) at 09/23/2020 1119 Last data filed at 09/23/2020 1000 Gross per 24 hour  Intake 1361.93 ml  Output 1275 ml  Net 86.93 ml   Filed Weights   09/21/20 0907 09/22/20 0255 09/23/20 0344  Weight: 65.8 kg 66.9 kg 70.4 kg    Examination: General: awake, alert, intubated, in mitts, appears anxious HENT: facial  bruising, ett to vent, NG tube in place Lungs: clear to auscultation bilaterally, no wheezes or crackles, Increased RR Cardiovascular: RRR no mrg Abdomen: Nondistended, obese, hypoactive bowel sounds. Nontender. Extremities: scattered ecchymoses, no edema Neuro: anxious, awake, alert, follows commands   Labs personally reviewed: K 2.9 Na 136 Cr 10.01 WBC 11.1 Hgb 7.0  Resolved Hospital Problem list     Assessment & Plan:   Acute metabolic encephalopathy  Suspect polypharmacy from multiple meds at home (xanax, flexeril, percocet) EEG, CTA, CT not revealing for etiology Minimize sedating medications  Circulatory Shock - hypotensive likely secondary to hypovolemia vs positive pressure from ventilator - improved with IVF - norepinephrine titrated down this morning - Continue to hold home losartan, amlodipine, lasix.  Acute hypoxemic respiratory failure Possible Aspiration vs CAP Secondary to mental status - plan for SBT to extubate this morning - procalcitonin elevated started unasyn 7/28. May consider de-escalation pending respiratory culture which has been reincubated for further growth.  - MRSA pcr positive  Acute on chronic anemia Possibly acute blood loss on chronic disease? Oozing from central line placemnet - s/p 1 unit prbc on 7/29 - monitor for goal >7  Hypokalemia: 2.9 will replace and replete   Hypothyroidism TSH normal -Synthroid 50 mcg IV, convert to enteral when  able   Best Practice (right click and "Reselect all SmartList Selections" daily)   Diet/type: NPO DVT prophylaxis: SCD GI prophylaxis: PPI Lines: N/A Foley:  N/A Code Status:  full code Last date of multidisciplinary goals of care discussion [Pending]    Labs   CBC: Recent Labs  Lab 09/21/20 0224 09/21/20 0305 09/21/20 0500 09/21/20 0855 09/22/20 0833 09/22/20 1616 09/23/20 0334  WBC 10.6*  --  8.1  --  13.2* 13.4* 11.1*  NEUTROABS 8.1*  --   --   --   --   --   --   HGB 8.6*   < > 7.2* 10.2* 6.3* 7.4* 7.0*  HCT 28.3*   < > 23.5* 30.0* 19.8* 22.8* 22.0*  MCV 120.9*  --  120.5*  --  113.8* 108.1* 108.4*  PLT 202  --  171  --  138* 147* 136*   < > = values in this interval not displayed.    Basic Metabolic Panel: Recent Labs  Lab 09/21/20 0224 09/21/20 0305 09/21/20 0334 09/21/20 0500 09/21/20 0855 09/21/20 2123 09/22/20 0833 09/22/20 1615 09/23/20 0334  NA 141 143 142 139 145  --  142  --  136  K 5.0 5.0 4.6 5.2* 4.0 3.9 3.3*  --  2.9*  CL 114* 114*  --  112*  --   --  111  --  109  CO2 19*  --   --  22  --   --  24  --  20*  GLUCOSE 176* 177*  --  168*  --   --  103*  --  152*  BUN 22 23  --  21  --   --  28*  --  31*  CREATININE 0.96 0.80  --  0.85  --   --  1.13*  --  1.01*  CALCIUM 8.5*  --   --  8.0*  --   --  7.1*  --  7.1*  MG  --   --   --  1.8  --   --   --  2.2 2.2  PHOS  --   --   --  3.2  --   --   --  2.9 2.4*   GFR: Estimated Creatinine Clearance: 40.8 mL/min (A) (by C-G formula based on SCr of 1.01 mg/dL (H)). Recent Labs  Lab 09/21/20 0500 09/21/20 0736 09/21/20 1121 09/21/20 1459 09/21/20 2323 09/22/20 0833 09/22/20 1616 09/23/20 0334  PROCALCITON  --   --   --   --   --  13.14  --   --   WBC 8.1  --   --   --   --  13.2* 13.4* 11.1*  LATICACIDVEN  --  3.5* 3.5* 5.6* 2.3*  --   --   --     Liver Function Tests: Recent Labs  Lab 09/21/20 0224 09/22/20 0833  AST 34 49*  ALT 22 37  ALKPHOS 149* 97  BILITOT 0.7 1.1  PROT  7.1 4.9*  ALBUMIN 3.8 2.3*   No results for input(s): LIPASE, AMYLASE in the last 168 hours. No results for input(s): AMMONIA in the last 168 hours.  ABG    Component Value Date/Time  PHART 7.365 09/21/2020 0855   PCO2ART 38.2 09/21/2020 0855   PO2ART 78 (L) 09/21/2020 0855   HCO3 22.0 09/21/2020 0855   TCO2 23 09/21/2020 0855   ACIDBASEDEF 3.0 (H) 09/21/2020 0855   O2SAT 95.0 09/21/2020 0855     Coagulation Profile: Recent Labs  Lab 09/21/20 0224  INR 1.0    Cardiac Enzymes: No results for input(s): CKTOTAL, CKMB, CKMBINDEX, TROPONINI in the last 168 hours.  HbA1C: No results found for: HGBA1C  CBG: Recent Labs  Lab 09/21/20 0223 09/22/20 1917 09/22/20 2315 09/23/20 0308 09/23/20 0730  GLUCAP 188* 124* 137* 157* 127*      Critical care time: 72    The patient is critically ill due to circulatory shock, encephalopathy, respiratory failure.  Critical care was necessary to treat or prevent imminent or life-threatening deterioration.  Critical care was time spent personally by me on the following activities: development of treatment plan with patient and/or surrogate as well as nursing, discussions with consultants, evaluation of patient's response to treatment, examination of patient, obtaining history from patient or surrogate, ordering and performing treatments and interventions, ordering and review of laboratory studies, ordering and review of radiographic studies, pulse oximetry, re-evaluation of patient's condition and participation in multidisciplinary rounds.   Critical Care Time devoted to patient care services described in this note is 33 minutes. This time reflects time of care of this [place] . This critical care time does not reflect separately billable procedures or procedure time, teaching time or supervisory time of PA/NP/Med student/Med Resident etc but could involve care discussion time.       [facility] 09/23/2020 11:26 AM  Pager: see AMION  If no response to pager , please call critical care on call (see AMION) until 7pm After 7:00 pm call Elink

## 2020-09-23 NOTE — Progress Notes (Signed)
Patient noted to be coughing during evening rounds, dinner tray in front of patient. Patient tachypneic and labored, spo2 97% on 2L O2, hypertensive in 170's, tachy 120's. States she didn't actually swallow any food, she put it in her mouth, and then felt "sick" and started coughing/retching. Unsure if diet consistency was problematic or increased sputum? Patient states she normally eats a regular diet at home. Patient noted to cough up large amount of yellow sputum afterwards. CCM aware, SLP eval placed.

## 2020-09-23 NOTE — Progress Notes (Signed)
Pt repeatedly pointing to stomach & nodded "yes" when RN asked pt if she was nauseous; PRN zofran '4mg'$  IV given. Tube feedings put on hold. BM x1 this AM. Will report off to day shift RN.

## 2020-09-23 NOTE — Progress Notes (Addendum)
eLink Physician-Brief Progress Note Patient Name: Cynthia Benson DOB: 1940/10/02 MRN: [id number]   Date of Service  09/23/2020  HPI/Events of Note  Patient with issues with anxiety and hyperventulating. Seen on camera. Very low dose precedex started. Doing well but RN does note issue with secretions. Tried eating earlier but had a lot of coughing and has a speech/swallow eval pending in AM.   eICU Interventions  Agree with precedex, O2 97 on nasal cannula and current RR is 22. HR 108, bp stable Placed an NPO order till AM s/s evaluation  D.w RN      Intervention Category Major Interventions: Respiratory failure - evaluation and management  Margaretmary Lombard 09/23/2020, 7:47 PM  12:20 am Has a headache nad chronic back pain Aspiration risk so RN asking for the tylenol to be changed to rectal form Order placed Also can use lidocaine patch for back pain  Trying to avoid sedative meds

## 2020-09-23 NOTE — Procedures (Signed)
Extubation Procedure Note  Patient Details:   Name: Cynthia Benson DOB: 03-22-1940 MRN: [id number]   Airway Documentation:    Vent end date: 09/23/20 Vent end time: 0823   Evaluation  O2 sats: stable throughout Complications: No apparent complications Patient did tolerate procedure well. Bilateral Breath Sounds: Clear, Diminished   Pt extubated to 4L [place] per MD order. Pt had positive cuff leak prior to extubation. No stridor noted. Pt able to voice her name.   Vilinda Blanks 09/23/2020, 8:24 AM

## 2020-09-24 ENCOUNTER — Inpatient Hospital Stay (HOSPITAL_COMMUNITY): Payer: Medicare Other

## 2020-09-24 DIAGNOSIS — E876 Hypokalemia: Secondary | ICD-10-CM | POA: Diagnosis not present

## 2020-09-24 DIAGNOSIS — Z79899 Other long term (current) drug therapy: Secondary | ICD-10-CM

## 2020-09-24 DIAGNOSIS — E039 Hypothyroidism, unspecified: Secondary | ICD-10-CM

## 2020-09-24 DIAGNOSIS — G9341 Metabolic encephalopathy: Secondary | ICD-10-CM | POA: Diagnosis not present

## 2020-09-24 LAB — GLUCOSE, CAPILLARY
Glucose-Capillary: 113 mg/dL — ABNORMAL HIGH (ref 70–99)
Glucose-Capillary: 119 mg/dL — ABNORMAL HIGH (ref 70–99)

## 2020-09-24 LAB — CBC
HCT: 23 % — ABNORMAL LOW (ref 36.0–46.0)
Hemoglobin: 7.2 g/dL — ABNORMAL LOW (ref 12.0–15.0)
MCH: 34.8 pg — ABNORMAL HIGH (ref 26.0–34.0)
MCHC: 31.3 g/dL (ref 30.0–36.0)
MCV: 111.1 fL — ABNORMAL HIGH (ref 80.0–100.0)
Platelets: 136 10*3/uL — ABNORMAL LOW (ref 150–400)
RBC: 2.07 MIL/uL — ABNORMAL LOW (ref 3.87–5.11)
RDW: 20.4 % — ABNORMAL HIGH (ref 11.5–15.5)
WBC: 10.8 10*3/uL — ABNORMAL HIGH (ref 4.0–10.5)
nRBC: 0.2 % (ref 0.0–0.2)

## 2020-09-24 LAB — BASIC METABOLIC PANEL
Anion gap: 11 (ref 5–15)
BUN: 20 mg/dL (ref 8–23)
CO2: 21 mmol/L — ABNORMAL LOW (ref 22–32)
Calcium: 7.9 mg/dL — ABNORMAL LOW (ref 8.9–10.3)
Chloride: 109 mmol/L (ref 98–111)
Creatinine, Ser: 0.82 mg/dL (ref 0.44–1.00)
GFR, Estimated: >60 mL/min (ref >60)
Glucose, Bld: 115 mg/dL — ABNORMAL HIGH (ref 70–99)
Potassium: 4.1 mmol/L (ref 3.5–5.1)
Sodium: 141 mmol/L (ref 135–145)

## 2020-09-24 LAB — MAGNESIUM: Magnesium: 2.2 mg/dL (ref 1.7–2.4)

## 2020-09-24 LAB — PHOSPHORUS: Phosphorus: 2.9 mg/dL (ref 2.5–4.6)

## 2020-09-24 MED ORDER — ACETAMINOPHEN 650 MG RE SUPP
650.0000 mg | Freq: Four times a day (QID) | RECTAL | Status: DC | PRN
  Administered 2020-09-24: 650 mg via RECTAL

## 2020-09-24 MED ORDER — DM-GUAIFENESIN ER 30-600 MG PO TB12
1.0000 | ORAL_TABLET | Freq: Two times a day (BID) | ORAL | Status: DC
  Administered 2020-09-24 – 2020-09-27 (×7): 1 via ORAL
  Filled 2020-09-24 (×8): qty 1

## 2020-09-24 MED ORDER — PANTOPRAZOLE SODIUM 40 MG PO TBEC
40.0000 mg | DELAYED_RELEASE_TABLET | Freq: Every day | ORAL | Status: DC
  Administered 2020-09-24 – 2020-09-26 (×3): 40 mg via ORAL
  Filled 2020-09-24 (×3): qty 1

## 2020-09-24 MED ORDER — LEVOTHYROXINE SODIUM 100 MCG PO TABS
100.0000 ug | ORAL_TABLET | Freq: Every day | ORAL | Status: DC
  Administered 2020-09-24 – 2020-09-27 (×4): 100 ug via ORAL
  Filled 2020-09-24 (×4): qty 1

## 2020-09-24 MED ORDER — KETOROLAC TROMETHAMINE 15 MG/ML IJ SOLN
7.5000 mg | Freq: Once | INTRAMUSCULAR | Status: AC
  Administered 2020-09-24: 7.5 mg via INTRAVENOUS
  Filled 2020-09-24: qty 1

## 2020-09-24 MED ORDER — PREGABALIN 25 MG PO CAPS
25.0000 mg | ORAL_CAPSULE | Freq: Three times a day (TID) | ORAL | Status: DC
  Administered 2020-09-24 – 2020-09-27 (×10): 25 mg via ORAL
  Filled 2020-09-24 (×11): qty 1

## 2020-09-24 MED ORDER — BETHANECHOL CHLORIDE 10 MG PO TABS
5.0000 mg | ORAL_TABLET | Freq: Three times a day (TID) | ORAL | Status: DC
  Administered 2020-09-24 – 2020-09-27 (×10): 5 mg via ORAL
  Filled 2020-09-24 (×11): qty 1

## 2020-09-24 MED ORDER — ACETAMINOPHEN 650 MG RE SUPP
650.0000 mg | Freq: Four times a day (QID) | RECTAL | Status: DC | PRN
  Filled 2020-09-24: qty 1

## 2020-09-24 MED ORDER — LOSARTAN POTASSIUM 50 MG PO TABS
25.0000 mg | ORAL_TABLET | Freq: Every day | ORAL | Status: DC
  Administered 2020-09-24: 25 mg via ORAL
  Filled 2020-09-24: qty 1

## 2020-09-24 MED ORDER — ALBUTEROL SULFATE (2.5 MG/3ML) 0.083% IN NEBU
2.5000 mg | INHALATION_SOLUTION | Freq: Once | RESPIRATORY_TRACT | Status: AC
  Administered 2020-09-24: 2.5 mg via RESPIRATORY_TRACT
  Filled 2020-09-24: qty 3

## 2020-09-24 MED ORDER — AMITRIPTYLINE HCL 10 MG PO TABS
10.0000 mg | ORAL_TABLET | Freq: Every day | ORAL | Status: DC
  Administered 2020-09-24 – 2020-09-25 (×2): 10 mg via ORAL
  Filled 2020-09-24 (×2): qty 1

## 2020-09-24 MED ORDER — LABETALOL HCL 5 MG/ML IV SOLN
10.0000 mg | INTRAVENOUS | Status: DC | PRN
  Administered 2020-09-25 (×2): 10 mg via INTRAVENOUS
  Filled 2020-09-24 (×2): qty 4

## 2020-09-24 MED ORDER — LIDOCAINE 5 % EX PTCH
1.0000 | MEDICATED_PATCH | Freq: Every day | CUTANEOUS | Status: DC
  Administered 2020-09-24 – 2020-09-25 (×3): 1 via TRANSDERMAL
  Filled 2020-09-24 (×3): qty 1

## 2020-09-24 MED ORDER — TRAZODONE HCL 50 MG PO TABS
50.0000 mg | ORAL_TABLET | Freq: Every evening | ORAL | Status: DC | PRN
  Administered 2020-09-24: 50 mg via ORAL
  Filled 2020-09-24: qty 1

## 2020-09-24 NOTE — Progress Notes (Signed)
Pt continues to report "being in misery" 2/2 achy, constant pain all over. Lidocaine patch is present on lower back. Order received for toradol 7.'5mg'$ ; pt reports 0 relief after administration.   Precedex gtt remains at 1.2 mcg/kg/min. Frequent repositioning and pillow support in attempt to provide comfort. Copious emotional support and reassurance given to patient, calm environment promoted with tv/music distraction techniques implemented. Pt states she has only slept for "maybe 30 mins" all night. All of this has been communicated to the [place] team by RN.  Will continue to monitor.

## 2020-09-24 NOTE — Evaluation (Signed)
Physical Therapy Evaluation Patient Details Name: Cynthia Benson MRN: [id number] DOB: [birth year] Today's Date: 09/24/2020   History of Present Illness  Pt is an 80 yo female presenting as code stroke after a fall with R periorbital frontal hematoma. Encephalopathy suspected to be from polypharmacy with CTH negative for acute findings. Pt had an episode of emesis with EMS requiring urgent intubation (ETT 7/28-7/30). CXR concerning for aspiration. PMH includes: HTN, hypothyroidism, chronic back pain with narcotic use, pt reported h/o reflux  Clinical Impression  PTA, pt lives with her spouse and son and is independent. Pt presents with decreased functional mobility secondary to generalized weakness, balance deficits, and pain (chronic back pain, headache). Pt received with bowel incontinence in setting of coughing episodes. Pt able to statically stand to get cleaned up, and then able to take pivotal steps from bed to chair. Requiring up to min assist for mobility. Suspect pt will progress well. Will continue to follow acutely to promote mobility as tolerated.     Follow Up Recommendations Home health PT;Supervision for mobility/OOB    Equipment Recommendations  None recommended by PT    Recommendations for Other Services       Precautions / Restrictions Precautions Precautions: Fall Restrictions Weight Bearing Restrictions: No      Mobility  Bed Mobility Overal bed mobility: Needs Assistance Bed Mobility: Supine to Sit     Supine to sit: Supervision     General bed mobility comments: cues for initiation    Transfers Overall transfer level: Needs assistance Equipment used: None Transfers: Sit to/from Stand Sit to Stand: Min assist         General transfer comment: Light minA to rise and steady  Ambulation/Gait Ambulation/Gait assistance: Min assist Gait Distance (Feet): 3 Feet Assistive device: None Gait Pattern/deviations: Step-through pattern;Decreased  stride length;Trunk flexed Gait velocity: decreased   General Gait Details: Pivotal steps from bed to chair, light minA for balance/stability  Stairs            Wheelchair Mobility    Modified Rankin (Stroke Patients Only) Modified Rankin (Stroke Patients Only) Pre-Morbid Rankin Score: No significant disability Modified Rankin: Moderately severe disability     Balance Overall balance assessment: Needs assistance Sitting-balance support: Feet supported Sitting balance-Leahy Scale: Fair     Standing balance support: No upper extremity supported;During functional activity Standing balance-Leahy Scale: Fair                               Pertinent Vitals/Pain Pain Assessment: Faces Faces Pain Scale: Hurts even more Pain Location: back, head Pain Descriptors / Indicators: Discomfort;Grimacing;Guarding;Headache Pain Intervention(s): Limited activity within patient's tolerance;Monitored during session;Patient requesting pain meds-RN notified    Home Living Family/patient expects to be discharged to:: Private residence Living Arrangements: Spouse/significant other;Children (husband, son) Available Help at Discharge: Family;Available 24 hours/day Type of Home: Mobile home Home Access: Ramped entrance     Home Layout: One level Home Equipment: Wheelchair - [employer] - 4 wheels      Prior Function Level of Independence: Independent               Hand Dominance        Extremity/Trunk Assessment   Upper Extremity Assessment Upper Extremity Assessment: Generalized weakness    Lower Extremity Assessment Lower Extremity Assessment: Generalized weakness    Cervical / Trunk Assessment Cervical / Trunk Assessment: Kyphotic  Communication   Communication: No difficulties  Cognition Arousal/Alertness: Awake/alert  Behavior During Therapy: WFL for tasks assessed/performed Overall Cognitive Status: Within Functional Limits for tasks assessed                                         General Comments General comments (skin integrity, edema, etc.): SpO2 > 88% on RA, placed back on 1L O2 at end of session.    Exercises     Assessment/Plan    PT Assessment Patient needs continued PT services  PT Problem List Decreased strength;Decreased activity tolerance;Decreased mobility;Decreased balance;Pain       PT Treatment Interventions DME instruction;Gait training;Stair training;Therapeutic exercise;Therapeutic activities;Balance training;Patient/family education    PT Goals (Current goals can be found in the Care Plan section)  Acute Rehab PT Goals Patient Stated Goal: less pain PT Goal Formulation: With patient Time For Goal Achievement: 10/08/20 Potential to Achieve Goals: Good    Frequency Min 4X/week   Barriers to discharge        Co-evaluation               AM-PAC PT "6 Clicks" Mobility  Outcome Measure Help needed turning from your back to your side while in a flat bed without using bedrails?: A Little Help needed moving from lying on your back to sitting on the side of a flat bed without using bedrails?: A Little Help needed moving to and from a bed to a chair (including a wheelchair)?: A Little Help needed standing up from a chair using your arms (e.g., wheelchair or bedside chair)?: A Little Help needed to walk in hospital room?: A Little Help needed climbing 3-5 steps with a railing? : A Lot 6 Click Score: 17    End of Session Equipment Utilized During Treatment: Gait belt Activity Tolerance: Patient tolerated treatment well Patient left: in chair;with call bell/phone within reach;with chair alarm set Nurse Communication: Mobility status PT Visit Diagnosis: Unsteadiness on feet (R26.81);Muscle weakness (generalized) (M62.81);History of falling (Z91.81)    Time: [id number] PT Time Calculation (min) (ACUTE ONLY): 28 min   Charges:   PT Evaluation $PT Eval Moderate Complexity: 1 Mod PT  Treatments $Therapeutic Activity: 8-22 mins        [name], PT, [name] Acute Rehabilitation Services Pager [phone] Office [phone]   Deno Etienne 09/24/2020, 10:06 AM

## 2020-09-24 NOTE — Progress Notes (Signed)
Spoke with Mardene Celeste RN, aware to remove CVC.

## 2020-09-24 NOTE — Progress Notes (Signed)
eLink Physician-Brief Progress Note Patient Name: Cynthia Benson DOB: January 28, 1941 MRN: [id number]   Date of Service  09/24/2020  HPI/Events of Note  Notified of hypertension despite hydralazine prn Home med Losartan already ordered Patient seen asleep BP 180/84  HR 89  eICU Interventions  Labetalol 10 mg prn for SBP > 170     Intervention Category Major Interventions: Hypertension - evaluation and management  Judd Lien 09/24/2020, 11:41 PM

## 2020-09-24 NOTE — Progress Notes (Signed)
NAME:  Cynthia Benson, MRN:  [id number], DOB:  March 25, 1940, LOS: 3 ADMISSION DATE:  09/21/2020, CONSULTATION DATE: 09/21/2020 REFERRING MD: Dr. Christy Gentles, CHIEF COMPLAINT: Acute encephalopathy  Brief HPI:  Feyza Sandgren is a 80 y.o. female with a history of hypertension, hypothyroidism, chronic back pain with narcotic use who presented to the [facility] ED as code stroke following fall.  LKW 9 PM 7/27, was found on the floor around 1 AM after evidence of fall, right periorbital frontal hematoma.  While en route to ED via EMS patient had an episode of emesis, which required urgent intubation.  Etiology of her altered mental status felt to be either due to trauma versus CVA.  Head CT and CT angio head and neck were both unrevealing in the ED.  No reported overt seizure activity.  UDS pending.  Chest x-ray postintubation with a left perihilar infiltrate concerning for aspiration.  Pertinent  Medical History  Hypothyroidism Hypertension Chronic back pain with intermittent opiate use   Significant Hospital Events: Including procedures, antibiotic start and stop dates in addition to other pertinent events   Head CT 7/28 >> no acute abnormality.  Right frontal periorbital scalp contusion without any evidence of calvarial fracture CT-angio head neck 7/28 >> no evidence for large vessel occlusion, perfusion abnormality CT C-spine 7/28 >> no acute C-spine trauma, stable C-spine degeneration since 2019 Chest x-ray 7/28 >> ET tube in position, left perihilar infiltrate 7/29 failed SBT for apnea 7/30 extubated  Interim History / Subjective:  Had SLP evaluation after aspiration yesterday - cleared for regular diet.  This morning awake, alert, sitting up in chair.   Objective   Blood pressure 140/62, pulse 60, temperature (!) 97.5 F (36.4 C), temperature source Oral, resp. rate 18, height '5\' 2"'$  (1.575 m), weight 70.8 kg, SpO2 94 %. CVP:  [5 mmHg-14 mmHg] 14 mmHg      Intake/Output  Summary (Last 24 hours) at 09/24/2020 1136 Last data filed at 09/24/2020 0900 Gross per 24 hour  Intake 965.43 ml  Output 1375 ml  Net -409.57 ml   Filed Weights   09/22/20 0255 09/23/20 0344 09/24/20 0400  Weight: 66.9 kg 70.4 kg 70.8 kg    Examination: General: awake, alert, intubated, in mitts, appears anxious HENT: facial  bruising, ett to vent, NG tube in place Lungs: clear to auscultation bilaterally, no wheezes or crackles, Increased RR Cardiovascular: RRR no mrg Abdomen: Nondistended, obese, hypoactive bowel sounds. Nontender. Extremities: scattered ecchymoses, no edema Neuro: anxious, awake, alert, follows commands   Labs personally reviewed: K 4.1 Na 141 Cr 0.8 WBC 10.8 Hgb 7.2  Resolved Hospital Problem list   Circulatory shock Respiratory failure   Assessment & Plan:   Acute metabolic encephalopathy  Chronic Pain Suspect polypharmacy from multiple meds at home (xanax, flexeril, percocet) EEG, CTA, CT not revealing for etiology Improving. Will resume low dose lyrica, elavil, trazdone prn. Will not resume flexeril, percocet or xanax for now. Advised patient that she is on too many sedating medications which may be contributing to dizziness and falls  Acute hypoxemic respiratory failure Possible Aspiration vs CAP Total 5 days unasyn, will time out abx  Acute on chronic anemia Possibly acute blood loss on chronic disease? Oozing from central line placemnet - s/p 1 unit prbc on 7/29 - monitor for goal >7  Hypokalemia:  Improved with replacement Monitor with BMP  Hypothyroidism TSH normal - resume synthroid home dose  HTN -resume home losartan  Best Practice (right click  and "Reselect all SmartList Selections" daily)   Diet/type: NPO DVT prophylaxis: SCD GI prophylaxis: PPI Lines: N/A Foley:  N/A Code Status:  full code Last date of multidisciplinary goals of care discussion [Pending]  Ok for transfer to [facility], will sign out to [facility].  I think  she needs PT/OT for safe homegoing plan and optimizing home medications for safe discharge.   Lenice Llamas, MD Pulmonary and [place]   Labs   CBC: Recent Labs  Lab 09/21/20 [phone] 09/21/20 0305 09/21/20 0500 09/21/20 0855 09/22/20 0833 09/22/20 1616 09/23/20 0334 09/24/20 0413  WBC 10.6*  --  8.1  --  13.2* 13.4* 11.1* 10.8*  NEUTROABS 8.1*  --   --   --   --   --   --   --   HGB 8.6*   < > 7.2* 10.2* 6.3* 7.4* 7.0* 7.2*  HCT 28.3*   < > 23.5* 30.0* 19.8* 22.8* 22.0* 23.0*  MCV 120.9*  --  120.5*  --  113.8* 108.1* 108.4* 111.1*  PLT 202  --  171  --  138* 147* 136* 136*   < > = values in this interval not displayed.    Basic Metabolic Panel: Recent Labs  Lab 09/21/20 0500 09/21/20 0855 09/21/20 2123 09/22/20 0833 09/22/20 1615 09/23/20 0334 09/23/20 1310 09/23/20 1826 09/24/20 0413  NA 139 145  --  142  --  136 139  --  141  K 5.2* 4.0 3.9 3.3*  --  2.9* 4.3  --  4.1  CL 112*  --   --  111  --  109 112*  --  109  CO2 22  --   --  24  --  20* 21*  --  21*  GLUCOSE 168*  --   --  103*  --  152* 119*  --  115*  BUN 21  --   --  28*  --  31* 27*  --  20  CREATININE 0.85  --   --  1.13*  --  1.01* 0.94  --  0.82  CALCIUM 8.0*  --   --  7.1*  --  7.1* 7.6*  --  7.9*  MG 1.8  --   --   --  2.2 2.2  --  2.1 2.2  PHOS 3.2  --   --   --  2.9 2.4*  --  2.8 2.9   GFR: Estimated Creatinine Clearance: 50.4 mL/min (by C-G formula based on SCr of 0.82 mg/dL). Recent Labs  Lab 09/21/20 0736 09/21/20 1121 09/21/20 1459 09/21/20 2323 09/22/20 0833 09/22/20 1616 09/23/20 0334 09/24/20 0413  PROCALCITON  --   --   --   --  13.14  --   --   --   WBC  --   --   --   --  13.2* 13.4* 11.1* 10.8*  LATICACIDVEN 3.5* 3.5* 5.6* 2.3*  --   --   --   --     Liver Function Tests: Recent Labs  Lab 09/21/20 0224 09/22/20 0833  AST 34 49*  ALT 22 37  ALKPHOS 149* 97  BILITOT 0.7 1.1  PROT 7.1 4.9*  ALBUMIN 3.8 2.3*   No results for  input(s): LIPASE, AMYLASE in the last 168 hours. No results for input(s): AMMONIA in the last 168 hours.  ABG    Component Value Date/Time   PHART 7.365 09/21/2020 0855   PCO2ART 38.2 09/21/2020 0855   PO2ART 78 (  L) 09/21/2020 0855   HCO3 22.0 09/21/2020 0855   TCO2 23 09/21/2020 0855   ACIDBASEDEF 3.0 (H) 09/21/2020 0855   O2SAT 95.0 09/21/2020 0855     Coagulation Profile: Recent Labs  Lab 09/21/20 0224  INR 1.0    Cardiac Enzymes: No results for input(s): CKTOTAL, CKMB, CKMBINDEX, TROPONINI in the last 168 hours.  HbA1C: No results found for: HGBA1C  CBG: Recent Labs  Lab 09/23/20 1536 09/23/20 1945 09/23/20 2314 09/24/20 0304 09/24/20 0735  GLUCAP 110* 126* 114* 113* 119*

## 2020-09-24 NOTE — Progress Notes (Addendum)
Patient sitting up in chair. C/o of feeling SOB. Patient noted with audible wheeze, tachypneic and labored in the 30's, coughing up large amount of thick, yellow sputum. Spo2 97% on 2L O2 via [place]. CCM aware, order obtained for neb tx.

## 2020-09-24 NOTE — Evaluation (Signed)
Clinical/Bedside Swallow Evaluation Patient Details  Name: Cynthia Benson MRN: [id number] Date of Birth: January 11, 1941  Today's Date: 09/24/2020 Time: SLP Start Time (ACUTE ONLY): 0831 SLP Stop Time (ACUTE ONLY): 0850 SLP Time Calculation (min) (ACUTE ONLY): 19 min  Past Medical History: No past medical history on file. Past Surgical History: History reviewed. No pertinent surgical history. HPI:  Pt is an 80 yo female presenting as code stroke after a fall with R periorbital frontal hematoma. Encephalopathy suspected to be from polypharmacy with CTH negative for acute findings. Pt had an episode of emesis with EMS requiring urgent intubation (ETT 7/28-7/30). CXR concerning for aspiration. PMH includes: HTN, hypothyroidism, chronic back pain with narcotic use, pt reported h/o reflux   Assessment / Plan / Recommendation Clinical Impression  Pt's oropharyngeal swallowing appears to be functional. She does have missing lower dentition, questionable mild L facial weakness/deviation of uvula (although imaging so far has been unrevealing), and reports a h/o reflux for which she takes medication. Despite these reports, she does not have any overt signs of aspiraiton or dysphagia. She takes bites and sips that are quite small and may be compensatory in nature given the above, but even when challenged with bigger boluses, she appears to do well. Recommend resuming regular solids and thin liquids. Given difficulties noted on previous date, will f/u briefly. SLP Visit Diagnosis: Dysphagia, unspecified (R13.10)    Aspiration Risk  Mild aspiration risk    Diet Recommendation Regular;Thin liquid   Liquid Administration via: Cup;Straw Medication Administration: Whole meds with liquid Supervision: Patient able to self feed;Intermittent supervision to cue for compensatory strategies Compensations: Minimize environmental distractions;Slow rate;Small sips/bites Postural Changes: Seated upright at 90  degrees;Remain upright for at least 30 minutes after po intake    Other  Recommendations Oral Care Recommendations: Oral care BID Other Recommendations: Have oral suction available   Follow up Recommendations  (tba)      Frequency and Duration min 1 x/week  1 week       Prognosis Prognosis for Safe Diet Advancement: Good      Swallow Study   General HPI: Pt is an 80 yo female presenting as code stroke after a fall with R periorbital frontal hematoma. Encephalopathy suspected to be from polypharmacy with CTH negative for acute findings. Pt had an episode of emesis with EMS requiring urgent intubation (ETT 7/28-7/30). CXR concerning for aspiration. PMH includes: HTN, hypothyroidism, chronic back pain with narcotic use, pt reported h/o reflux Type of Study: Bedside Swallow Evaluation Previous Swallow Assessment: says she had a test a long time ago that was normal Diet Prior to this Study: NPO Temperature Spikes Noted: No Respiratory Status: Nasal cannula History of Recent Intubation: Yes Length of Intubations (days): 2 days Date extubated: 09/23/20 Behavior/Cognition: Alert;Cooperative;Pleasant mood Oral Cavity Assessment: Within Functional Limits Oral Care Completed by SLP: Recent completion by staff Oral Cavity - Dentition: Dentures, top;Missing dentition (edentulous on bottom) Vision: Functional for self-feeding Self-Feeding Abilities: Able to feed self Patient Positioning: Upright in bed Baseline Vocal Quality: Normal Volitional Cough: Weak Volitional Swallow: Able to elicit    Oral/Motor/Sensory Function Overall Oral Motor/Sensory Function: Mild impairment Facial ROM: Reduced left;Suspected CN VII (facial) dysfunction Facial Symmetry: Suspected CN VII (facial) dysfunction;Abnormal symmetry left (mild) Facial Strength: Within Functional Limits Lingual ROM: Within Functional Limits Lingual Symmetry: Within Functional Limits Lingual Strength: Within Functional  Limits Velum: Impaired left;Suspected CN X (Vagus) dysfunction (deviation to L)   Ice Chips Ice chips: Within functional limits Presentation: Spoon  Thin Liquid Thin Liquid: Within functional limits Presentation: Cup;Self Fed;Straw    Nectar Thick Nectar Thick Liquid: Not tested   Honey Thick Honey Thick Liquid: Not tested   Puree Puree: Within functional limits Presentation: Self Fed;Spoon   Solid     Solid: Within functional limits Presentation: Self Fed      Osie Bond., M.A. [place] Pager [phone] Office [phone]  09/24/2020,8:57 AM

## 2020-09-24 NOTE — Progress Notes (Signed)
Pt continues to have difficulty managing secretions and feels like she is "choking", using yankuer suction frequently. During these coughing fits, she is observed to be tachycardic, tachypneic, and anxious. SpO2 remains 95-98% throughout. Precedex gtt was started earlier and has steadily been increased throughout the shift with no change in pt condition. Pt is now NPO and has SLP eval pending in AM.  Pt with multiple complaints of moderate to severe pain, "achy all over", especially in head 2/2 fall and lower back, which is chronic for her. Pt also requesting multiple times for "something to help her sleep" as precdex gtt is not achieving this goal at 0.15mg/kg/hr. Pt is also in room "praying to god to take this pain away". Home meds include percocet 7.5-'325mg'$ , xanax, lyrica, trazadone, flexeril, and ambien. Communicated this to [facility].  MD wants to avoid sedative medications and ordered tylenol suppository (to avoid further choking issues) and lidocaine pain patch.   eLink RN discussed the reasons for this directly with pt, who verbalized understanding.  Will continue to monitor.

## 2020-09-24 NOTE — Progress Notes (Signed)
Wheeze improved with neb tx. Patient still tachypniec RR 30-40's, labored and very worked up from coughing episode. Spo2 stable at 98% on O2. Patient continuing to cough up large amount of thick yellow sputum. Oral suctioning and NTS suctioning PRN with some short term relief. CCM aware, orders obtained for CXR and mucinex.  Calming techniques utilized.

## 2020-09-25 DIAGNOSIS — J9602 Acute respiratory failure with hypercapnia: Secondary | ICD-10-CM

## 2020-09-25 MED ORDER — TRAZODONE HCL 50 MG PO TABS
50.0000 mg | ORAL_TABLET | Freq: Every day | ORAL | Status: DC
  Administered 2020-09-25: 50 mg via ORAL
  Filled 2020-09-25: qty 1

## 2020-09-25 MED ORDER — LATANOPROST 0.005 % OP SOLN
1.0000 [drp] | Freq: Every day | OPHTHALMIC | Status: DC
  Administered 2020-09-25 – 2020-09-26 (×2): 1 [drp] via OPHTHALMIC
  Filled 2020-09-25: qty 2.5

## 2020-09-25 MED ORDER — LOSARTAN POTASSIUM 50 MG PO TABS
50.0000 mg | ORAL_TABLET | Freq: Every day | ORAL | Status: DC
  Administered 2020-09-25 – 2020-09-27 (×3): 50 mg via ORAL
  Filled 2020-09-25 (×4): qty 1

## 2020-09-25 NOTE — Progress Notes (Signed)
  Speech Language Pathology Treatment: Dysphagia  Patient Details Name: Cynthia Benson MRN: [id number] DOB: 1940/10/03 Today's Date: 09/25/2020 Time: [id number] SLP Time Calculation (min) (ACUTE ONLY): 15 min  Assessment / Plan / Recommendation Clinical Impression  Pt declined solid foods after reporting that she got sick after eating food at lunch. She believes that it went down okay, but then she became nauseous and threw up some of her potatoes. She says that this happened often PTA as well due to her "reflux," saying that she has a pill that she takes for "nausea" at home. She tries to avoid foods that trigger her reflux to try to help, and feels this is not acutely worsened this admission. No overt signs of oropharyngeal dysphagia or aspiration were observed with thin liquids today. Therefore I think she can continue with regular diet and thin liquids, but would use aspiration and esophageal precautions. MD may wish to consider a more dedicated esophageal test and/or GI consult if she continues to have regurgitation.     HPI HPI: Pt is an 80 yo female presenting as code stroke after a fall with R periorbital frontal hematoma. Encephalopathy suspected to be from polypharmacy with CTH negative for acute findings. Pt had an episode of emesis with EMS requiring urgent intubation (ETT 7/28-7/30). CXR concerning for aspiration. PMH includes: HTN, hypothyroidism, chronic back pain with narcotic use, pt reported h/o reflux      SLP Plan  Continue with current plan of care       Recommendations  Diet recommendations: Regular;Thin liquid Liquids provided via: Cup;Straw Medication Administration: Whole meds with liquid Supervision: Patient able to self feed;Intermittent supervision to cue for compensatory strategies Compensations: Minimize environmental distractions;Slow rate;Small sips/bites Postural Changes and/or Swallow Maneuvers: Seated upright 90 degrees;Upright 30-60 min after meal                 Oral Care Recommendations: Oral care BID Follow up Recommendations: None SLP Visit Diagnosis: Dysphagia, unspecified (R13.10) Plan: Continue with current plan of care       GO                Osie Bond., M.A. [place] Acute Rehabilitation Services Pager [phone] Office [phone]  09/25/2020, 3:45 PM

## 2020-09-25 NOTE — Progress Notes (Signed)
[facility] Progress Note  Patient: Tanika Bracco    CEY:[id number]  DOA: 09/21/2020     Date of Service: the patient was seen and examined on 09/25/2020  Brief hospital course: Past medical history of hypothyroidism, hypertension, chronic pain and anxiety.  Patient presents with confusion and a fall.  Required intubation with EMS.  Thought to be polypharmacy as a cause of primary presentation. Admitted to the ICU. Transferred to [facility] on 8/1. Currently plan is gradually reintroduce home medication..  Subjective: Denies any nausea or vomiting.  Reports bilateral knee pain as well as back pain.  Also unable to sleep at night.  Assessment and Plan: 1.  Acute metabolic encephalopathy. Chronic pain syndrome. Polypharmacy. Initially admitted to the ICU. Was intubated. EEG CTA and CT head negative for any acute abnormality. Currently on Lyrica Elavil and trazodone. Lower dose at home regimen. Holding Flexeril Percocet and Xanax. Palliative care consulted for symptom management and further assistance.  2.  Acute hypoxic respiratory failure requiring intubation. Aspiration pneumonia Continue with IV antibiotics. Oxygenation improving currently on room air. Extubated with Monitor.  Cultures so far negative.   3.  Acute on chronic anemia. SP 1 PRBC transfusion on 7/29. Hemoglobin remained stable.  Self suspect the acute anemia is likely dilutional in nature.  Monitor.  4.  Hypokalemia Replaced.  5.  Essential hypertension Blood pressure elevated. Increase losartan dose as well as add hydralazine. Labetalol as needed.  6.  Excessive secretion Started on bethanechol. Will continue for today and discontinue tomorrow.  Scheduled Meds:  amitriptyline  10 mg Oral QHS   bethanechol  5 mg Oral TID   chlorhexidine gluconate (MEDLINE KIT)  15 mL Mouth Rinse BID   dextromethorphan-guaiFENesin  1 tablet Oral BID   latanoprost  1 drop Right Eye QHS   levothyroxine  100 mcg  Oral Q0600   lidocaine  1 patch Transdermal QHS   losartan  50 mg Oral Daily   mupirocin ointment  1 application Nasal BID   pantoprazole  40 mg Oral QHS   pregabalin  25 mg Oral TID   traZODone  50 mg Oral QHS   Continuous Infusions:  sodium chloride     ampicillin-sulbactam (UNASYN) IV 1.5 g (09/25/20 1733)   PRN Meds: acetaminophen, acetaminophen, acetaminophen, hydrALAZINE, ondansetron (ZOFRAN) IV, polyethylene glycol  Body mass index is 28.59 kg/m.  Nutrition Problem: Inadequate oral intake Etiology: inability to eat     DVT Prophylaxis:   SCDs Start: 09/21/20 0419    Advance goals of care discussion: Pt is Full code.  Family Communication: no family was present at bedside, at the time of interview.   Data Reviewed: I have personally reviewed and interpreted daily labs, tele strips, imaging. Sodium potassium stable.  Serum creatinine stable.  Labs checked yesterday.  Physical Exam:  General: Appear in mild distress, no Rash; Oral Mucosa Clear, moist. no Abnormal Neck Mass Or lumps, Conjunctiva normal  Cardiovascular: S1 and S2 Present, no Murmur, Respiratory: good respiratory effort, Bilateral Air entry present and bilateral expiratory wheezes Abdomen: Bowel Sound present, Soft and no tenderness Extremities: no Pedal edema Neurology: alert and oriented to time, place, and person affect appropriate. no new focal deficit Gait not checked due to patient safety concerns  Vitals:   09/25/20 0900 09/25/20 1000 09/25/20 1100 09/25/20 1533  BP: (!) 172/121 (!) 146/115 (!) 167/76 (!) 171/70  Pulse: 79 85 87 83  Resp: _0 Temp:   98.1 F (36.7 C)  98.7 F (37.1 C)  TempSrc:   Oral Oral  SpO2: 90% (!) 86% 95% 95%  Weight:      Height:        Disposition:  Status is: Inpatient  Remains inpatient appropriate because:IV treatments appropriate due to intensity of illness or inability to take PO and Inpatient level of care appropriate due to severity of  illness  Dispo: The patient is from: Home              Anticipated d/c is to: SNF              Patient currently is not medically stable to d/c.   Difficult to place patient No   Time spent: 35 minutes. I reviewed all nursing notes, pharmacy notes, vitals, pertinent old records. I have discussed plan of care as described above with RN.  Author: Berle Mull, MD [facility] 09/25/2020 7:58 PM  To reach On-call, see care teams to locate the attending and reach out via [email or web address]. Between 7PM-7AM, please contact night-coverage If you still have difficulty reaching the attending provider, please page the [facility] (Director on Call) for [facility] on amion for assistance.

## 2020-09-25 NOTE — Progress Notes (Signed)
E-link called by this RN due to patient's sustained hypertension despite PRN hydralazine given. Most recent BP was 180/84 and SBP goal is <170.  New orders for labetalol '10mg'$  PRN every 4 hours received. Will continue to monitor patient.    Wyn Quaker, RN

## 2020-09-25 NOTE — Progress Notes (Signed)
Physical Therapy Treatment Patient Details Name: Cynthia Benson MRN: [id number] DOB: Jan 06, 1941 Today's Date: 09/25/2020    History of Present Illness Pt is an 80 yo female presenting as code stroke after a fall with R periorbital frontal hematoma. Encephalopathy suspected to be from polypharmacy with CTH negative for acute findings. Pt had an episode of emesis with EMS requiring urgent intubation (ETT 7/28-7/30). CXR concerning for aspiration. PMH includes: HTN, hypothyroidism, chronic back pain with narcotic use, pt reported h/o reflux    PT Comments    Pt making steady progress towards her physical therapy goals. Pt reports feeling fatigued after another night of no sleep, however, she is pleasant and agreeable to participate in therapy session. Pt ambulating 60 ft with a Rollator at a min assist level. Able to perform toileting and subsequent pericare without physical assist. D/c plan remains appropriate.    Follow Up Recommendations  Home health PT;Supervision for mobility/OOB     Equipment Recommendations  None recommended by PT    Recommendations for Other Services       Precautions / Restrictions Precautions Precautions: Fall Restrictions Weight Bearing Restrictions: No    Mobility  Bed Mobility Overal bed mobility: Needs Assistance Bed Mobility: Supine to Sit     Supine to sit: Supervision     General bed mobility comments: cues for initiation    Transfers Overall transfer level: Needs assistance Equipment used: None Transfers: Sit to/from Stand Sit to Stand: Min assist;Min guard         General transfer comment: up to minA to rise from low toilet surface  Ambulation/Gait Ambulation/Gait assistance: Min assist Gait Distance (Feet): 60 Feet Assistive device: None Gait Pattern/deviations: Step-through pattern;Decreased stride length;Trunk flexed Gait velocity: decreased   General Gait Details: Cues for sequencing/direction and locking Rollator  prior to transitions. Pt requiring light minA for stability   Stairs             Wheelchair Mobility    Modified Rankin (Stroke Patients Only) Modified Rankin (Stroke Patients Only) Pre-Morbid Rankin Score: No significant disability Modified Rankin: Moderately severe disability     Balance Overall balance assessment: Needs assistance Sitting-balance support: Feet supported Sitting balance-Leahy Scale: Fair     Standing balance support: No upper extremity supported;During functional activity Standing balance-Leahy Scale: Fair                              Cognition Arousal/Alertness: Awake/alert Behavior During Therapy: WFL for tasks assessed/performed Overall Cognitive Status: Within Functional Limits for tasks assessed                                        Exercises      General Comments        Pertinent Vitals/Pain Pain Assessment: Faces Faces Pain Scale: Hurts little more Pain Location: back, head Pain Descriptors / Indicators: Discomfort;Guarding;Headache Pain Intervention(s): Monitored during session    Home Living                      Prior Function            PT Goals (current goals can now be found in the care plan section) Acute Rehab PT Goals Patient Stated Goal: less pain PT Goal Formulation: With patient Time For Goal Achievement: 10/08/20 Potential to Achieve Goals: Good Progress towards  PT goals: Progressing toward goals    Frequency    Min 4X/week      PT Plan Current plan remains appropriate    Co-evaluation              AM-PAC PT "6 Clicks" Mobility   Outcome Measure  Help needed turning from your back to your side while in a flat bed without using bedrails?: A Little Help needed moving from lying on your back to sitting on the side of a flat bed without using bedrails?: A Little Help needed moving to and from a bed to a chair (including a wheelchair)?: A Little Help needed  standing up from a chair using your arms (e.g., wheelchair or bedside chair)?: A Little Help needed to walk in hospital room?: A Little Help needed climbing 3-5 steps with a railing? : A Lot 6 Click Score: 17    End of Session Equipment Utilized During Treatment: Gait belt Activity Tolerance: Patient tolerated treatment well Patient left: in chair;with call bell/phone within reach;with chair alarm set Nurse Communication: Mobility status PT Visit Diagnosis: Unsteadiness on feet (R26.81);Muscle weakness (generalized) (M62.81);History of falling (Z91.81)     Time: [id number] PT Time Calculation (min) (ACUTE ONLY): 23 min  Charges:  $Therapeutic Activity: 23-37 mins                     [name], PT, [name] Acute Rehabilitation Services Pager [phone] Office [place] 09/25/2020, 8:43 AM

## 2020-09-26 DIAGNOSIS — Z515 Encounter for palliative care: Secondary | ICD-10-CM

## 2020-09-26 DIAGNOSIS — Z7189 Other specified counseling: Secondary | ICD-10-CM

## 2020-09-26 DIAGNOSIS — Z66 Do not resuscitate: Secondary | ICD-10-CM

## 2020-09-26 LAB — IRON AND TIBC
Iron: 77 ug/dL (ref 28–170)
Saturation Ratios: 36 % — ABNORMAL HIGH (ref 10.4–31.8)
TIBC: 214 ug/dL — ABNORMAL LOW (ref 250–450)
UIBC: 137 ug/dL

## 2020-09-26 LAB — CBC WITH DIFFERENTIAL/PLATELET
Abs Immature Granulocytes: 0.75 10*3/uL — ABNORMAL HIGH (ref 0.00–0.07)
Basophils Absolute: 0.1 10*3/uL (ref 0.0–0.1)
Basophils Relative: 1 %
Eosinophils Absolute: 0.1 10*3/uL (ref 0.0–0.5)
Eosinophils Relative: 1 %
HCT: 22.3 % — ABNORMAL LOW (ref 36.0–46.0)
Hemoglobin: 7.3 g/dL — ABNORMAL LOW (ref 12.0–15.0)
Immature Granulocytes: 12 %
Lymphocytes Relative: 17 %
Lymphs Abs: 1.1 10*3/uL (ref 0.7–4.0)
MCH: 34.6 pg — ABNORMAL HIGH (ref 26.0–34.0)
MCHC: 32.7 g/dL (ref 30.0–36.0)
MCV: 105.7 fL — ABNORMAL HIGH (ref 80.0–100.0)
Monocytes Absolute: 0.4 10*3/uL (ref 0.1–1.0)
Monocytes Relative: 6 %
Neutro Abs: 4.1 10*3/uL (ref 1.7–7.7)
Neutrophils Relative %: 63 %
Platelets: 199 10*3/uL (ref 150–400)
RBC: 2.11 MIL/uL — ABNORMAL LOW (ref 3.87–5.11)
RDW: 19.5 % — ABNORMAL HIGH (ref 11.5–15.5)
WBC: 6.5 10*3/uL (ref 4.0–10.5)
nRBC: 0.3 % — ABNORMAL HIGH (ref 0.0–0.2)

## 2020-09-26 LAB — BASIC METABOLIC PANEL
Anion gap: 11 (ref 5–15)
BUN: 15 mg/dL (ref 8–23)
CO2: 22 mmol/L (ref 22–32)
Calcium: 8.3 mg/dL — ABNORMAL LOW (ref 8.9–10.3)
Chloride: 103 mmol/L (ref 98–111)
Creatinine, Ser: 0.89 mg/dL (ref 0.44–1.00)
GFR, Estimated: >60 mL/min (ref >60)
Glucose, Bld: 92 mg/dL (ref 70–99)
Potassium: 3.1 mmol/L — ABNORMAL LOW (ref 3.5–5.1)
Sodium: 136 mmol/L (ref 135–145)

## 2020-09-26 LAB — VITAMIN B12: Vitamin B-12: 664 pg/mL (ref 180–914)

## 2020-09-26 LAB — CULTURE, BLOOD (ROUTINE X 2)
Culture: NO GROWTH
Culture: NO GROWTH
Special Requests: ADEQUATE
Special Requests: ADEQUATE

## 2020-09-26 LAB — FERRITIN: Ferritin: 615 ng/mL — ABNORMAL HIGH (ref 11–307)

## 2020-09-26 LAB — RETICULOCYTES
Immature Retic Fract: 19.8 % — ABNORMAL HIGH (ref 2.3–15.9)
RBC.: 2.13 MIL/uL — ABNORMAL LOW (ref 3.87–5.11)
Retic Count, Absolute: 33.7 10*3/uL (ref 19.0–186.0)
Retic Ct Pct: 1.6 % (ref 0.4–3.1)

## 2020-09-26 LAB — TECHNOLOGIST SMEAR REVIEW

## 2020-09-26 LAB — MAGNESIUM: Magnesium: 1.9 mg/dL (ref 1.7–2.4)

## 2020-09-26 MED ORDER — ACETAMINOPHEN 325 MG PO TABS
650.0000 mg | ORAL_TABLET | Freq: Three times a day (TID) | ORAL | Status: DC
  Administered 2020-09-26 – 2020-09-27 (×4): 650 mg via ORAL
  Filled 2020-09-26 (×5): qty 2

## 2020-09-26 MED ORDER — ADULT MULTIVITAMIN W/MINERALS CH
1.0000 | ORAL_TABLET | Freq: Every day | ORAL | Status: DC
  Administered 2020-09-27: 1 via ORAL
  Filled 2020-09-26 (×2): qty 1

## 2020-09-26 MED ORDER — LIDOCAINE 5 % EX PTCH
3.0000 | MEDICATED_PATCH | CUTANEOUS | Status: DC
  Administered 2020-09-26 – 2020-09-27 (×2): 3 via TRANSDERMAL
  Filled 2020-09-26 (×2): qty 3

## 2020-09-26 MED ORDER — AMLODIPINE BESYLATE 5 MG PO TABS
5.0000 mg | ORAL_TABLET | Freq: Every day | ORAL | Status: DC
  Administered 2020-09-26 – 2020-09-27 (×2): 5 mg via ORAL
  Filled 2020-09-26 (×3): qty 1

## 2020-09-26 MED ORDER — TRAZODONE HCL 100 MG PO TABS
100.0000 mg | ORAL_TABLET | Freq: Every day | ORAL | Status: DC
  Administered 2020-09-26: 100 mg via ORAL
  Filled 2020-09-26: qty 1

## 2020-09-26 MED ORDER — ENSURE ENLIVE PO LIQD
237.0000 mL | Freq: Two times a day (BID) | ORAL | Status: DC
  Administered 2020-09-26: 237 mL via ORAL

## 2020-09-26 NOTE — Progress Notes (Signed)
Physical Therapy Treatment Patient Details Name: Cynthia Benson MRN: [id number] DOB: August 16, 1940 Today's Date: 09/26/2020    History of Present Illness Pt is an 80 yo female presenting as code stroke after a fall with R periorbital frontal hematoma. Encephalopathy suspected to be from polypharmacy with CTH negative for acute findings. Pt had an episode of emesis with EMS requiring urgent intubation (ETT 7/28-7/30). CXR concerning for aspiration. PMH includes: HTN, hypothyroidism, chronic back pain with narcotic use, pt reported h/o reflux    PT Comments    Pt making good progress towards her physical therapy goals today, exhibiting improved activity tolerance and ambulation distance. Able to participate in seated exercises for BLE strengthening and then ambulating x 120 feet with a walker at a supervision level. D/c plan remains appropriate.    Follow Up Recommendations  Home health PT;Supervision for mobility/OOB     Equipment Recommendations  None recommended by PT    Recommendations for Other Services       Precautions / Restrictions Precautions Precautions: Fall Restrictions Weight Bearing Restrictions: No    Mobility  Bed Mobility               General bed mobility comments: OOB in chair    Transfers Overall transfer level: Needs assistance Equipment used: None Transfers: Sit to/from Stand Sit to Stand: Supervision         General transfer comment: supervision for safety  Ambulation/Gait Ambulation/Gait assistance: Supervision Gait Distance (Feet): 120 Feet Assistive device: Rolling walker (2 wheeled) Gait Pattern/deviations: Step-through pattern;Decreased stride length;Trunk flexed Gait velocity: decreased   General Gait Details: Slow and steady pace, no gross imbalance noted   Stairs             Wheelchair Mobility    Modified Rankin (Stroke Patients Only) Modified Rankin (Stroke Patients Only) Pre-Morbid Rankin Score: No  significant disability Modified Rankin: Moderately severe disability     Balance Overall balance assessment: Needs assistance Sitting-balance support: Feet supported Sitting balance-Leahy Scale: Good     Standing balance support: No upper extremity supported;During functional activity Standing balance-Leahy Scale: Fair                              Cognition Arousal/Alertness: Awake/alert Behavior During Therapy: WFL for tasks assessed/performed Overall Cognitive Status: Within Functional Limits for tasks assessed                                        Exercises General Exercises - Lower Extremity Long Arc Quad: Both;10 reps;Seated    General Comments        Pertinent Vitals/Pain Pain Assessment: Faces Faces Pain Scale: Hurts little more Pain Location: back, head Pain Descriptors / Indicators: Discomfort;Guarding;Headache Pain Intervention(s): Monitored during session    Home Living                      Prior Function            PT Goals (current goals can now be found in the care plan section) Acute Rehab PT Goals Patient Stated Goal: less pain PT Goal Formulation: With patient Time For Goal Achievement: 10/08/20 Potential to Achieve Goals: Good Progress towards PT goals: Progressing toward goals    Frequency    Min 4X/week      PT Plan Current plan remains appropriate  Co-evaluation              AM-PAC PT "6 Clicks" Mobility   Outcome Measure  Help needed turning from your back to your side while in a flat bed without using bedrails?: A Little Help needed moving from lying on your back to sitting on the side of a flat bed without using bedrails?: A Little Help needed moving to and from a bed to a chair (including a wheelchair)?: A Little Help needed standing up from a chair using your arms (e.g., wheelchair or bedside chair)?: A Little Help needed to walk in hospital room?: A Little Help needed  climbing 3-5 steps with a railing? : A Lot 6 Click Score: 17    End of Session Equipment Utilized During Treatment: Gait belt Activity Tolerance: Patient tolerated treatment well Patient left: in chair;with call bell/phone within reach;with chair alarm set Nurse Communication: Mobility status PT Visit Diagnosis: Unsteadiness on feet (R26.81);Muscle weakness (generalized) (M62.81);History of falling (Z91.81)     Time: 1047-1100 PT Time Calculation (min) (ACUTE ONLY): 13 min  Charges:  $Therapeutic Activity: 8-22 mins                     [name], PT, [name] Acute Rehabilitation Services Pager [phone] Office [phone]    Deno Etienne 09/26/2020, 11:06 AM

## 2020-09-26 NOTE — Care Management Important Message (Signed)
Important Message  Patient Details  Name: Cynthia Benson MRN: [id number] Date of Birth: 1941-02-08   Medicare Important Message Given:  Yes     Orbie Pyo 09/26/2020, 2:47 PM

## 2020-09-26 NOTE — Plan of Care (Signed)

## 2020-09-26 NOTE — Progress Notes (Signed)
Nutrition Follow-up  DOCUMENTATION CODES:   Not applicable  INTERVENTION:   - Ensure Enlive po BID, each supplement provides 350 kcal and 20 grams of protein  - MVI with minerals daily  - Encourage PO intake  NUTRITION DIAGNOSIS:   Inadequate oral intake related to inability to eat as evidenced by NPO status.  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  7/30 - extubated 7/31 - regular diet, thin liquids  Spoke with pt at bedside. Pt reports appetite is at her baseline. Pt normally does not eat much for breakfast. Pt eats a small lunch and a large dinner. Pt reports that for breakfast today she had oatmeal and coffee. RD discussed with pt the importance of adequate PO intake in healing. RD to order oral nutrition supplements to aid pt in meeting kcal and protein needs. Will also order daily MVI with minerals.  Admit weight: 65.8 kg Current weight: 70.9 kg  Meal Completion: 75-100%  Medications reviewed and include: protonix  Labs reviewed: potassium 3.1, hemoglobin 7.3 CBG's: 110-126 x 24 hours  Diet Order:   Diet Order             Diet regular Room service appropriate? Yes with Assist; Fluid consistency: Thin  Diet effective now                   EDUCATION NEEDS:   Education needs have been addressed  Skin:  Skin Assessment: MASD: buttocks  Last BM:  09/26/20 large type 6  Height:   Ht Readings from Last 1 Encounters:  09/21/20 '5\' 2"'$  (1.575 m)    Weight:   Wt Readings from Last 1 Encounters:  09/26/20 70.9 kg    BMI:  Body  mass index is 28.59 kg/m.  Estimated Nutritional Needs:   Kcal:  1700-1900  Protein:  90-105 grams  Fluid:  1.7-1.9 L    Gustavus Bryant, MS, RD, LDN Inpatient Clinical Dietitian Please see AMiON for contact information.

## 2020-09-26 NOTE — Progress Notes (Signed)
This chaplain responded to PMT consult for creating or updating the Pt. Advance Directive.  The Pt. is awake and able to participate in [place] education.    The chaplain assisted the Pt. in filling out the [facility] document in preparation for notarizing the HCPOA on Wednesday. The HCPOA document is in the Pt. chart.  The chaplain listened reflectively as the Pt. shared the roles of family and church in her story.  The chaplain phoned the Pt. husband-Donald for the Pt. daughter-Darlene phone number [phone]. The chaplain understands [name] will be the Pt. healthcare agent.  The Pt. accepted the chaplain's invitation for prayer and F/U spiritual care.

## 2020-09-26 NOTE — Consult Note (Addendum)
Palliative Medicine Inpatient Consult Note  Consulting Provider: Lavina Hamman, MD  Reason for consult:   [place] [facility] Consult   Symptom Management Consult  Reason for Consult? symptoms management as well goals of care   HPI:  Per intake H&P --> Past medical history of hypothyroidism, hypertension, chronic pain and anxiety.  Patient presents with confusion and a fall.  Required intubation with EMS.  Thought to be polypharmacy as a cause of primary presentation. Has been extubated and transitioned to monitored observation. Palliative care was asked to get involved to aid in chronic pain symptom management and address further goals of care.  Clinical Assessment/Goals of Care:  *Please note that this is a verbal dictation therefore any spelling or grammatical errors are due to the "[place] [facility]" system interpretation.  I have reviewed medical records including EPIC notes, labs and imaging, received report from bedside RN, assessed the patient who was lying in  bed in NAD.    I met with Cynthia Benson to further discuss diagnosis prognosis, GOC, EOL wishes, disposition and options.  [name] discussed her reason for hospital admission inclusive of the fact that she had suffered a fall in her home.  We reviewed that there is great concern perhaps this was in the setting of polypharmacy.  We reviewed in detail her medications which she is taking at home.  I shared with her 2 of the medications which were concerning to me inclusive of her trazodone which she shares she only takes sometimes and presently amitriptyline which is a tricyclic antidepressant.  I shared that often these medications in our older patients can carry with him more harm than benefit. She is willing to allow myself, the medical team, and clinical pharmacy to more formally review her education and make further modifications.   We further reviewed Moya's present pneumonia and history of  HTN and hypothyroidism.    I introduced Palliative Medicine as specialized medical care for people living with serious illness. It focuses on providing relief from the symptoms and stress of a serious illness. The goal is to improve quality of life for both the patient and the family.  Cynthia Benson shares with me that she lives in [place], [place].  She has been married to her husband Cynthia Benson for the past 28 years.  This is her second marriage.  She has 3 children from her first marriage, Cynthia Benson, Cynthia Benson, and Cynthia Benson.  She shares that she has a relationship with all of her children though her daughter Cynthia Benson is her primary advocate.  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  She shares with me that she likes doing "everything herself".  She is a woman who cannot see anyone else doing basic daily tasks for her.  She is a woman of faith and practices within the [facility] denomination.  To hospitalization Cynthia Benson had had a robust appetite.  She expresses to me that she had been able to do all of her basic activities of daily living and was still driving.  She expresses to me that her husband is still the person in her life who "pays the bills".  A detailed discussion was had today regarding advanced directives - Cynthia Benson has never completed these though she would be willing to do so during this hospitalization.  Expresses that if she were ever for any reason incapacitated she would wish that her daughter Cynthia Benson be her primary Media planner.    Concepts specific to code status, artifical  feeding and hydration, continued IV antibiotics and rehospitalization was had.  A MOST form was introduced and completed following wishes:  Cardiopulmonary Resuscitation: Do Not Attempt Resuscitation (DNR/No CPR)  Medical Interventions: Limited Additional Interventions: Use medical treatment, IV fluids and cardiac monitoring as indicated, DO NOT USE intubation or mechanical ventilation. May consider use of less invasive  airway support such as BiPAP or CPAP. Also provide comfort measures. Transfer to the hospital if indicated. Avoid intensive care.   Antibiotics: Antibiotics if indicated  IV Fluids: IV fluids if indicated  Feeding Tube: Feeding tube for a defined trial period   From a symptom perspective Cynthia Benson shares with me that she suffers from chronic pain.  She expresses that she is on a multitude of medications many of which have been recently changed.  Per review I shared that she is presently on Lyrica, as needed Tylenol while an inpatient.  I suggested liberalizing the Tylenol to 3 times a day dosing and adding some Lidoderm patches for her lower back and knee pain.  I shared that I will stop the amitriptyline as I suspect this is playing a role in her falling.  We also reviewed that oxycodone at the frequency she is taking it does not seem to be effective.  I shared that Flexeril is a drug in her older patients which carries with it high side effects and could also have contributed to her falling.  I shared that given all of her side effects we need to start low with medications and go slow with uptitrating them.  We discussed polypharmacy.  We again opened up the medication record and reviewed in detail each medication she is currently taking.  We reviewed her history of anxiety and that she is prescribed Xanax though she only takes it as needed.  She shares that she has been receiving prescriptions for Xanax since 1994.  I expressed to her that there are multitude of other drugs that may serve her more safely 1 of which being BuSpar which I typically recommend for geriatric patients given its lower side effect profile.   From the perspective of insomnia I shared that I understand the trazodone had been decreased initially though I think increasing it to day 200 mg would be reasonable as we have since stopped her amitriptyline.  We reviewed the need to maintain consistent bowel movements in the setting of  consistent use of opioids in the outpatient setting.  Per chart review she her last bowel movement was 8 /2.  Discussed the importance of continued conversation with family and their  medical providers regarding overall plan of care and treatment options, ensuring decisions are within the context of the patients values and GOCs.  Decision Maker: Is only patient is alert and oriented x4 and able to make decisions for herself.  If for any reason she were unable to advocate for herself she would wish for her daughter, Cynthia Benson to be her primary Media planner.  SUMMARY OF RECOMMENDATIONS   DNAR/DNI  MOST Completed, paper copy placed onto the chart electric copy can be found in [place]  DNR Form Completed, paper copy placed onto the chart electric copy can be found in [place] helping to complete Advance Directives  Symptom management as below  Ongoing Palliative support  Code Status/Advance Care Planning: DNAR/DNI   Symptom Management:  Chronic Pain, mostly neuropathic/arthritic: - Tylenol $RemoveB'650mg'KfLXXpyP$  PO TID - Lyrica $Remove'25mg'NwWZvVj$  TID - Lidocaine to back and knees - Oxycodone has been stopped (was taking  often w/o relief likely d/t the fact that pain is neuropathic) - Amitriptyline has been stopped (BEER's Criteria indicates this to be high risk for fall in the elderly) - Flexeril has been stopped (increase risk of dizziness/falls)  Anxiety: - Xanax has been stopped - Buspar 2.$RemoveBe'5mg'EOtTZmktI$  PO TID PRN  Insomnia: - Trazodone $RemoveBef'100mg'JQIVFOpJeq$  PO QHS  Polypharmacy: - Appreciate Albertina Parr, MPH doing a comprehensive review of medication. It appears patients had been on multiple offensive agents for a geriatric woman.  Falls: - Physical activity - Ensure wearing eyeglasses or contact lenses - Ensure wearing hearing aid - Find out about the side effects of any medicine you take - Ensure adequate sleep - Stand up slowly - Use an assistive devices  - Wear non-skid, rubber-soled, low-heeled  shoes, or lace-up shoes with non-skid soles that fully support feet.    Palliative Prophylaxis:  Oral Care, Mobility, Delirium precautions  Additional Recommendations (Limitations, Scope, Preferences): Treat what is treatable   Psycho-social/Spiritual:  Desire for further Chaplaincy support: No Additional Recommendations: Education on medication management   Prognosis: Unclear. First admission in last six months. (+) Hypoalbuminemia. Few co-morbidities.   Discharge Planning: Discharge to home with [facility] when medically optimized.  Vitals:   09/26/20 0000 09/26/20 0344  BP:  (!) 165/72  Pulse:    Resp:    Temp: 98.8 F (37.1 C) 98.8 F (37.1 C)  SpO2:      Intake/Output Summary (Last 24 hours) at 09/26/2020 2446 Last data filed at 09/25/2020 1533 Gross per 24 hour  Intake 500 ml  Output 250 ml  Net 250 ml   Last Weight  Most recent update: 09/25/2020  5:17 AM    Weight  70.9 kg (156 lb 4.9 oz)            Gen:  Frail elderly geriatric woman in NAD HEENT: Facial bruises, small hematoma on forehead, moist mucous membranes CV: Regular rate and rhythm  PULM: On 2LPM [place] ABD: soft/nontender EXT: No edema  Neuro: Alert and oriented x3   PPS: 50%   This conversation/these recommendations were discussed with patient primary care team, Dr. Posey Pronto  Time In: 0820 Time Out: 0955 Total Time: 85 Greater than 50%  of this time was spent counseling and coordinating care related to the above assessment and plan.  [place] Team Team Cell Phone: [phone] Please utilize secure chat with additional questions, if there is no response within 30 minutes please call the above phone number  Palliative Medicine Team providers are available by phone from 7am to 7pm daily and can be reached through the team cell phone.  Should this patient require assistance outside of these hours, please call the patient's attending physician.

## 2020-09-26 NOTE — Progress Notes (Signed)
[facility] Progress Note  Patient: Cynthia Benson    YEL:[id number]  DOA: 09/21/2020     Date of Service: the patient was seen and examined on 09/26/2020  Brief hospital course: Past medical history of hypothyroidism, hypertension, chronic pain and anxiety.  Patient presents with confusion and a fall.  Required intubation with EMS.  Thought to be polypharmacy as a cause of primary presentation. Admitted to the ICU. Transferred to [facility] on 8/1. Currently plan is gradually reintroduce home medication..  Subjective: Slept okay after receiving her medication.  No nausea no vomiting or no fever no chills.  No chest pain abdominal pain.  Bilateral knee pain improving as well.  Assessment and Plan: 1.  Acute metabolic encephalopathy. Chronic pain syndrome. Polypharmacy. Initially admitted to the ICU. Was intubated. EEG CTA and CT head negative for any acute abnormality. Currently on Lyrica Elavil and trazodone. Lower dose at home regimen. Holding Flexeril Percocet and Xanax. Palliative care consulted for symptom management and further assistance.  Appreciate assistance.  2.  Acute hypoxic respiratory failure requiring intubation. Aspiration pneumonia Continue with IV antibiotics. Oxygenation improving currently on room air. Extubated with Monitor.  Cultures so far negative.   3.  Acute on chronic anemia. SP 1 PRBC transfusion on 7/29. Hemoglobin remained stable.  Self suspect the acute anemia is likely dilutional in nature.  Monitor.  4.  Hypokalemia Replaced.  5.  Essential hypertension Blood pressure elevated. Increase losartan dose as well as add hydralazine. Labetalol as needed.  6.  Excessive secretion Started on bethanechol. Will continue for today and discontinue tomorrow.  Scheduled Meds:  acetaminophen  650 mg Oral TID   amLODipine  5 mg Oral Daily   bethanechol  5 mg Oral TID   chlorhexidine gluconate (MEDLINE KIT)  15 mL Mouth Rinse BID    dextromethorphan-guaiFENesin  1 tablet Oral BID   feeding supplement  237 mL Oral BID BM   latanoprost  1 drop Right Eye QHS   levothyroxine  100 mcg Oral Q0600   lidocaine  3 patch Transdermal Q24H   losartan  50 mg Oral Daily   [START ON 09/27/2020] multivitamin with minerals  1 tablet Oral Daily   pantoprazole  40 mg Oral QHS   pregabalin  25 mg Oral TID   traZODone  100 mg Oral QHS   Continuous Infusions:  sodium chloride     PRN Meds: hydrALAZINE, ondansetron (ZOFRAN) IV, polyethylene glycol  Body mass index is 28.59 kg/m.  Nutrition Problem: Inadequate oral intake Etiology: inability to eat     DVT Prophylaxis:   SCDs Start: 09/21/20 0419    Advance goals of care discussion: Pt is Full code.  Family Communication: no family was present at bedside, at the time of interview.   Data Reviewed: I have personally reviewed and interpreted daily labs, tele strips, imaging. Mild hypokalemia.  Creatinine stable.  Hemoglobin stable.  No iron deficiency.  No B12 deficiency.  Reticulocyte count normal.  Physical Exam:  General: Appear in mild distress, no Rash; Oral Mucosa Clear, moist. no Abnormal Neck Mass Or lumps, Conjunctiva normal, right eye bruising Cardiovascular: S1 and S2 Present, no Murmur, Respiratory: good respiratory effort, Bilateral Air entry present and CTA, no Crackles, no wheezes Abdomen: Bowel Sound present, Soft and no tenderness Extremities: no Pedal edema Neurology: alert and oriented to time, place, and person affect appropriate. no new focal deficit Gait not checked due to patient safety concerns   Vitals:   09/26/20 1333 09/26/20 1518  09/26/20 1611 09/26/20 1643  BP: (!) 170/79 (!) 173/77 (!) 172/75 (!) 162/66  Pulse: 90 83    Resp:  (!) 22    Temp:  98.8 F (37.1 C)    TempSrc:  Oral    SpO2:  94%    Weight:      Height:        Disposition:  Status is: Inpatient  Remains inpatient appropriate because:IV treatments appropriate due to  intensity of illness or inability to take PO and Inpatient level of care appropriate due to severity of illness  Dispo: The patient is from: Home              Anticipated d/c is to: SNF              Patient currently is not medically stable to d/c.   Difficult to place patient No   Time spent: 35 minutes. I reviewed all nursing notes, pharmacy notes, vitals, pertinent old records. I have discussed plan of care as described above with RN.  Author: Berle Mull, MD [facility] 09/26/2020 6:31 PM  To reach On-call, see care teams to locate the attending and reach out via [email or web address]. Between 7PM-7AM, please contact night-coverage If you still have difficulty reaching the attending provider, please page the [facility] (Director on Call) for [facility] on amion for assistance.

## 2020-09-27 LAB — CBC WITH DIFFERENTIAL/PLATELET
Abs Immature Granulocytes: 0.7 10*3/uL — ABNORMAL HIGH (ref 0.00–0.07)
Basophils Absolute: 0 10*3/uL (ref 0.0–0.1)
Basophils Relative: 0 %
Eosinophils Absolute: 0.2 10*3/uL (ref 0.0–0.5)
Eosinophils Relative: 4 %
HCT: 25.8 % — ABNORMAL LOW (ref 36.0–46.0)
Hemoglobin: 8.3 g/dL — ABNORMAL LOW (ref 12.0–15.0)
Lymphocytes Relative: 18 %
Lymphs Abs: 1.1 10*3/uL (ref 0.7–4.0)
MCH: 33.7 pg (ref 26.0–34.0)
MCHC: 32.2 g/dL (ref 30.0–36.0)
MCV: 104.9 fL — ABNORMAL HIGH (ref 80.0–100.0)
Metamyelocytes Relative: 4 %
Monocytes Absolute: 0.4 10*3/uL (ref 0.1–1.0)
Monocytes Relative: 7 %
Myelocytes: 8 %
Neutro Abs: 3.6 10*3/uL (ref 1.7–7.7)
Neutrophils Relative %: 59 %
Platelets: 247 10*3/uL (ref 150–400)
RBC: 2.46 MIL/uL — ABNORMAL LOW (ref 3.87–5.11)
RDW: 18.9 % — ABNORMAL HIGH (ref 11.5–15.5)
WBC: 6.1 10*3/uL (ref 4.0–10.5)
nRBC: 0 /100 WBC
nRBC: 0.3 % — ABNORMAL HIGH (ref 0.0–0.2)

## 2020-09-27 LAB — BASIC METABOLIC PANEL
Anion gap: 11 (ref 5–15)
BUN: 11 mg/dL (ref 8–23)
CO2: 25 mmol/L (ref 22–32)
Calcium: 8.6 mg/dL — ABNORMAL LOW (ref 8.9–10.3)
Chloride: 100 mmol/L (ref 98–111)
Creatinine, Ser: 0.78 mg/dL (ref 0.44–1.00)
GFR, Estimated: >60 mL/min (ref >60)
Glucose, Bld: 103 mg/dL — ABNORMAL HIGH (ref 70–99)
Potassium: 3.2 mmol/L — ABNORMAL LOW (ref 3.5–5.1)
Sodium: 136 mmol/L (ref 135–145)

## 2020-09-27 LAB — MAGNESIUM: Magnesium: 1.9 mg/dL (ref 1.7–2.4)

## 2020-09-27 MED ORDER — AMLODIPINE BESYLATE 5 MG PO TABS: 5.0000 mg | ORAL_TABLET | Freq: Every day | ORAL | 0 refills | Status: DC

## 2020-09-27 MED ORDER — POTASSIUM CHLORIDE CRYS ER 20 MEQ PO TBCR
40.0000 meq | EXTENDED_RELEASE_TABLET | Freq: Every day | ORAL | Status: DC
  Administered 2020-09-27: 40 meq via ORAL
  Filled 2020-09-27: qty 2

## 2020-09-27 MED ORDER — ACETAMINOPHEN 325 MG PO TABS: 650.0000 mg | ORAL_TABLET | Freq: Three times a day (TID) | ORAL | Status: DC

## 2020-09-27 MED ORDER — BETHANECHOL CHLORIDE 5 MG PO TABS: 5.0000 mg | ORAL_TABLET | Freq: Three times a day (TID) | ORAL | 0 refills | Status: DC

## 2020-09-27 MED ORDER — PREGABALIN 25 MG PO CAPS: 25.0000 mg | ORAL_CAPSULE | Freq: Three times a day (TID) | ORAL | 0 refills | Status: DC

## 2020-09-27 NOTE — Progress Notes (Signed)
IV's removed, D/C instruction reviewed with pt.  Pt to be D/C home via wheelchair with family.

## 2020-09-27 NOTE — Progress Notes (Signed)
This chaplain joined the Pt., notary, and two witnesses for notarizing of the Pt. HCPOA.  The Pt. is awake and her questions about HCPOA were answered.  The Pt. named Cynthia Benson as her healthcare agent and Charlesetta Shanks as the next choice, if the healthcare agent is unwilling or unable to serve.  The chaplain returned the original HCPOA to the Pt. with two copies.  A copy of the Pt. HCPOA was scanned into the Pt. EMR.  This chaplain is available for F/U spiritual care as needed.

## 2020-09-27 NOTE — Progress Notes (Addendum)
Palliative Medicine Inpatient Follow Up Note  Consulting Provider: Lavina Hamman, MD   Reason for consult:   [place] Palliative Medicine Consult    Symptom Management Consult  Reason for Consult? symptoms management as well goals of care    HPI:  Per intake H&P --> Past medical history of hypothyroidism, hypertension, chronic pain and anxiety.  Patient presents with confusion and a fall.  Required intubation with EMS.  Thought to be polypharmacy as a cause of primary presentation. Has been extubated and transitioned to monitored observation. Palliative care was asked to get involved to aid in chronic pain symptom management and address further goals of care.  Today's Discussion (09/27/2020):  *Please note that this is a verbal dictation therefore any spelling or grammatical errors are due to the "[place] One" system interpretation.  Chart reviewed.  I spoke to patient's bedside RN, [name] this morning.  [name] and I reviewed patient's prior medical history and her active and acute medical problems.  We reviewed in detail her unfortunate misuse of medications.  Discussed the ongoing medical management of her polypharmacy.  [name] shares that she does still incrementally complain of discomfort though she does not actively appear to be uncomfortable and is redirectable.  At bedside with Cynthia Benson this morning.  She shares with me that last night was the first night she is slept since she has been in the hospital.  I reviewed in detail all of the medication changes that we have made.  She was thankful for this review.  We discussed the hope for her to be discharged soon.  She expresses that she has no additional palliative concerns and is looking forward to "getting home" as soon as possible.  Objective Assessment: Vital Signs Vitals:   09/27/20 0222 09/27/20 0736  BP: (!) 174/74 (!) 166/74  Pulse:  89  Resp:  16  Temp:  98.8 F (37.1 C)  SpO2:  92%     Intake/Output Summary (Last 24 hours) at 09/27/2020 1129 Last data filed at 09/27/2020 [id number] Gross per 24 hour  Intake 720 ml  Output 1100 ml  Net -380 ml   Last Weight  Most recent update: 09/27/2020  5:22 AM    Weight  70.9 kg (156 lb 4.9 oz)            Gen:  Frail elderly geriatric woman in NAD HEENT: Facial bruises, small hematoma on forehead, moist mucous membranes CV: Regular rate and rhythm PULM: On 2LPM [place] ABD: soft/nontender EXT: No edema Neuro: Alert and oriented x3  SUMMARY OF RECOMMENDATIONS   DNAR/DNI   MOST Completed, paper copy placed onto the chart electric copy can be found in [place]   DNR Form Completed, paper copy placed onto the chart electric copy can be found in [place] helping to complete Advance Directives   Symptom management as below   Ongoing Palliative support   Code Status/Advance Care Planning: DNAR/DNI   Symptom Management:  Chronic Pain, mostly neuropathic/arthritic: - Tylenol '650mg'$  PO TID - Lyrica '25mg'$  TID - Lidocaine to back and knees - Oxycodone has been stopped (was taking often w/o relief likely d/t the fact that pain is neuropathic) - Amitriptyline has been stopped (BEER's Criteria indicates this to be high risk for fall in the elderly) - Flexeril has been stopped (increase risk of dizziness/falls)   Anxiety: - Xanax has been stopped - Buspar 2.'5mg'$  PO TID PRN   Insomnia: - Trazodone '100mg'$  PO QHS  Polypharmacy: - Appreciate Albertina Parr, MPH doing a comprehensive review of medication. It appears patients had been on multiple offensive agents for a geriatric woman.   Falls: - Physical activity - Ensure wearing eyeglasses or contact lenses - Ensure wearing hearing aid - Find out about the side effects of any medicine you take - Ensure adequate sleep - Stand up slowly - Use an assistive devices - Wear non-skid, rubber-soled, low-heeled shoes, or lace-up shoes with non-skid soles that fully  support feet.  Greater than 50% of the time was spent in counseling and coordination of care  Total Time: 30 Greater than 50%  of this time was spent counseling and coordinating care related to the above assessment and plan. ______________________________________________________________________________________ [place] Team Team Cell Phone: [phone] Please utilize secure chat with additional questions, if there is no response within 30 minutes please call the above phone number  Palliative Medicine Team providers are available by phone from 7am to 7pm daily and can be reached through the team cell phone.  Should this patient require assistance outside of these hours, please call the patient's attending physician.

## 2020-09-27 NOTE — TOC Transition Note (Addendum)
Transition of Care [facility]) - CM/SW Discharge Note   Patient Details  Name: Cynthia Benson MRN: [id number] Date of Birth: [birth year]  Transition of Care [facility]) CM/SW Contact:  Sharin Mons, RN Phone Number: [phone]  09/27/2020, 12:00pm    Clinical Narrative:    Patient will DC to: home Anticipated DC date: 09/27/2020 Family notified: yes, husband Transport by: car  Admitted s/p fall , r/o code stoke/ periorbital frontal hematoma / encephalopathy.  Per MD patient ready for DC today. RN, patient, and  patient's family, aware of DC. NCM shared PT's recommendations for HHPT. Pt agreeable. Choice offered. Pt stated she has used [facility] in the past and would like to use them again. Referral for home health PT  services faxed to [facility] @ [phone]. Acceptance pending. Home address:  Pt without DME needs. No Rx med concerns.[place],  [place]  [id number]   09/27/2020 '@1500'$  [facility] unable to provide home health 2/2 limited staffing.  RNCM will sign off for now as intervention is no longer needed. Please consult [place] again if new needs arise.    Final next level of care: [place] Barriers to Discharge: Continued Medical Work up   Patient Goals and CMS Choice     Choice offered to / list presented to : Patient  Discharge Placement                       Discharge Plan and Services                          HH Arranged: PT [facility] Agency: [place] Date [place]: 09/27/20 Time [place]: [id number] Representative spoke with at [place]: [place] ([place]) Interventions     Readmission Risk Interventions No flowsheet data found.

## 2020-09-27 NOTE — Discharge Summary (Signed)
Physician Discharge Summary  [address] [place] MWU:[id number] DOB: [birth year] DOA: 09/21/2020  PCP: Lou Miner, MD  Admit date: 09/21/2020 Discharge date: 09/27/2020  Time spent: 36 minutes  Recommendations for Outpatient Follow-up:  Medication adjustments made on discharge to prevent polypharmacy and this should be continued in the outpatient setting by PCP Obtain CBC differential and Chem-12 in 1 week Consider work-up for macrocytic anemia in the outpatient setting as was transfused this admission Home health ordered   Discharge Diagnoses:  MAIN problem for hospitalization   Acute metabolic encephalopathy in the setting of polypharmacy  Please see below for itemized issues addressed in HOpsital- refer to other progress notes for clarity if needed  Discharge Condition: Improved  Diet recommendation: Heart healthy  Filed Weights   09/25/20 0500 09/26/20 0500 09/27/20 0500  Weight: 70.9 kg 70.9 kg 70.9 kg    History of present illness:  80 year old community dwelling female HTN postoperative hypothyroid chronic low back pain with narcotics Prior pilonidal cysts chronic 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  Right frontal periorbital scalp contusion without any evidence of calvarial fracture CT-angio head neck 7/28 >> no evidence for large vessel occlusion, perfusion abnormality CT C-spine 7/28 >> no acute C-spine trauma, stable C-spine degeneration since 2019 Chest x-ray 7/28 >> ET tube in position, left perihilar infiltrate  Hospital Course:  Toxic metabolic sepsis felt very present on admission secondary to polypharmacy from chronic pain Meds adjusted on  discharge and de-escalated several of them including Flexeril Xanax etc. Will need outpatient further modifications of med regimen according to PCP in the outpatient setting Acute hypoxic respiratory failure requiring intubation on admission Received 6 days of IV antibiotics and completed therapy no oxygen requirement Dilutional anemia present on admission Transfuse 1 unit of PRBC on 7/29--needs outpatient iron stores checked etc. and potentially B12 folate acid Hypertension Blood pressure elevated but will keep on home meds and have this monitor in the outpatient setting Excessive secretions Patient transiently given bethanechol which was discontinued  Procedures: As above   Consultations: none  Discharge Exam: Vitals:   09/27/20 0222 09/27/20 0736  BP: (!) 174/74 (!) 166/74  Pulse:  89  Resp:  16  Temp:  98.8 F (37.1 C)  SpO2:  92%    Subj on day of d/c   Awake alert coherent no distress seems to be comfortable  General Exam on discharge  EOMI NCAT no focal deficit CTA B no rales rhonchi Significant hematomas and bruising to the face Abdomen soft nontender no rebound no guarding ROM intact Pleasant  Discharge Instructions   Discharge Instructions     Diet - low sodium heart healthy   Complete by: As directed    Discharge instructions   Complete by: As directed    Look at all medications carefully as several have changed specifically ones that can cause confusion She will need close monitoring in the outpatient setting and will need home health therapy services to help her navigate and do well She will need labs in a week   Increase activity slowly   Complete by: As directed    No wound care   Complete by: As directed       Allergies as of 09/27/2020  Reactions   Moxifloxacin Rash        Medication List     STOP taking these medications    ALPRAZolam 0.25 MG tablet Commonly known as: XANAX   amitriptyline 10 MG tablet Commonly known  as: ELAVIL   cyclobenzaprine 10 MG tablet Commonly known as: FLEXERIL   furosemide 40 MG tablet Commonly known as: LASIX   oxyCODONE-acetaminophen 7.5-325 MG tablet Commonly known as: PERCOCET   promethazine 25 MG tablet Commonly known as: PHENERGAN       TAKE these medications    acetaminophen 325 MG tablet Commonly known as: TYLENOL Take 2 tablets (650 mg total) by mouth 3 (three) times daily.   amLODipine 5 MG tablet Commonly known as: NORVASC Take 1 tablet (5 mg total) by mouth daily. Start taking on: September 28, 2020   bethanechol 5 MG tablet Commonly known as: URECHOLINE Take 1 tablet (5 mg total) by mouth 3 (three) times daily.   FeroSul 325 (65 FE) MG tablet Generic drug: ferrous sulfate Take 325 mg by mouth 2 (two) times daily.   folic acid 1 MG tablet Commonly known as: FOLVITE Take 1 mg by mouth daily.   lansoprazole 30 MG capsule Commonly known as: PREVACID Take 30 mg by mouth 2 (two) times daily.   latanoprost 0.005 % ophthalmic solution Commonly known as: XALATAN Place 1 drop into the right eye at bedtime.   losartan 25 MG tablet Commonly known as: COZAAR Take 25 mg by mouth daily.   naloxone 4 MG/0.1ML Liqd nasal spray kit Commonly known as: NARCAN See admin instructions.   pregabalin 25 MG capsule Commonly known as: LYRICA Take 1 capsule (25 mg total) by mouth 3 (three) times daily. What changed:  medication strength how much to take   Synthroid 100 MCG tablet Generic drug: levothyroxine Take 100 mcg by mouth daily.   traZODone 50 MG tablet Commonly known as: DESYREL Take 50 mg by mouth at bedtime as needed for sleep.       Allergies  Allergen Reactions   Moxifloxacin Rash      The results of significant diagnostics from this hospitalization (including imaging, microbiology, ancillary and laboratory) are listed below for reference.    Significant Diagnostic Studies: DG CHEST PORT 1 VIEW  Result Date:  09/24/2020 CLINICAL DATA:  Follow-up of pneumonia EXAM: PORTABLE CHEST 1 VIEW COMPARISON:  09/21/2020 FINDINGS: Left-sided central line terminates at the superior caval/atrial junction. Midline trachea. Surgical clips in the right thoracic inlet. Extubation and removal of nasogastric tube. Moderate cardiomegaly. No pleural effusion or pneumothorax. Improved right base aeration with decreased airspace disease along the right heart border. Patchy left base airspace disease is not significantly changed. Diffuse interstitial thickening is nonspecific. IMPRESSION: Improved right lower lobe airspace disease, likely pneumonia. Similar left base patchy opacities, atelectasis or infection. Electronically Signed   By: Abigail Miyamoto M.D.   On: 09/24/2020 12:49   DG CHEST PORT 1 VIEW  Result Date: 09/21/2020 CLINICAL DATA:  Central line placement EXAM: PORTABLE CHEST 1 VIEW COMPARISON:  September 13, 2020 FINDINGS: The cardiomediastinal silhouette is unchanged in contour.LEFT IJ approach CVC tip terminates over the region of the superior cavoatrial junction. Unchanged LEFT apical pleural thickening. ETT tip terminates 5.3 cm above the carina. Enteric tube tip and side port project over the stomach. Small RIGHT pleural effusion with fluid within the fissure. No pneumothorax. Increased inferior perihilar predominant heterogeneous opacities. Visualized abdomen is unremarkable. Wedging of an inferior thoracic vertebral body, unchanged. Surgical clips project  over the RIGHT neck. IMPRESSION: 1. LEFT IJ approach CVC tip terminates over the region of the superior cavoatrial junction. 2. Increasing bibasilar infrahilar predominant heterogeneous opacities. Likely small RIGHT pleural effusion. Electronically Signed   By: Valentino Saxon MD   On: 09/21/2020 18:18   DG Chest Portable 1 View  Result Date: 09/21/2020 CLINICAL DATA:  Endotracheal tube and orogastric tube placement. EXAM: PORTABLE CHEST 1 VIEW COMPARISON:  Jun 28, 2020  FINDINGS: An endotracheal tube is seen with its distal tip approximately 3.1 cm from the carina. An orogastric tube is noted with its distal end extending into the body of the stomach. Mild, diffusely increased interstitial lung markings are noted. Marked severity left perihilar and left infrahilar infiltrate is seen. There is no evidence of a pleural effusion or pneumothorax. The heart size and mediastinal contours are within normal limits. Both lungs are clear. The visualized skeletal structures are unremarkable. IMPRESSION: 1. Endotracheal tube and orogastric tube positioning, as described above. 2. Marked severity left perihilar and left infrahilar infiltrates. Electronically Signed   By: Virgina Norfolk M.D.   On: 09/21/2020 02:57   DG Abd Portable 1V  Result Date: 09/22/2020 CLINICAL DATA:  OG tube position. EXAM: PORTABLE ABDOMEN - 1 VIEW COMPARISON:  None. FINDINGS: Gastric tube courses below the diaphragm and extends to the right of midline, with the tip most likely in proximal duodenum or distal stomach. A small amount of contrast noted within the stomach. Nonobstructive bowel gas pattern with moderate to large colonic stool burden. Please see same day chest radiograph for characterization of the lung bases. Degenerative changes of the spine. Lumbar fusion. Chain sutures in the left upper quadrant. IMPRESSION: Gastric tube courses below the diaphragm, with the tip most likely in proximal duodenum or distal stomach. Electronically Signed   By: Margaretha Sheffield MD   On: 09/22/2020 13:11   EEG adult  Result Date: 09/21/2020 Lora Havens, MD     09/21/2020  1:08 PM Patient Name: Cynthia Benson MRN: [id number] Epilepsy Attending: Lora Havens Referring Physician/Provider: Nevada Crane, PA Date: 09/21/2020 Duration: 22.37 mins Patient history: 80 year old woman found down by family and then noticed to have left facial weakness along with altered mental status and diminished level of  consciousness. Level of alertness: Awake AEDs during EEG study: None Technical aspects: This EEG study was done with scalp electrodes positioned according to the 10-20 International system of electrode placement. Electrical activity was acquired at a sampling rate of _0  and reviewed with a high frequency filter of _1  and a low frequency filter of _2 . EEG data were recorded continuously and digitally stored. Description: The posterior dominant rhythm consists of _3  activity of moderate voltage (25-35 uV) seen predominantly in posterior head regions, symmetric and reactive to eye opening and eye closing. EEG showed intermittent generalized 3 to 6 Hz theta-delta slowing.  Patient was noted to have intermittent whole-body jerks. Concomitant EEG before, during and after the event did not show any EEG changes suggest seizure. Hyperventilation and photic stimulation were not performed.   ABNORMALITY - Continuous slow, generalized IMPRESSION: This study is suggestive of mild diffuse encephalopathy, nonspecific etiology.  Patient was noted to have intermittent whole-body jerks without concomitant EEG change.  These episodes were not epileptic.  No seizures or epileptiform discharges were seen throughout the recording. Lora Havens   CT NO CHARGE  Result Date: 09/21/2020 CLINICAL DATA:  80 year old female code stroke presentation. Left facial droop. Fall. EXAM: CT CERVICAL SPINE WITH  CONTRAST TECHNIQUE: Multiplanar CT images of the cervical spine were reconstructed from contemporary CTA of the Neck. CONTRAST:  No additional COMPARISON:  CTA head and neck reported separately. Prior cervical spine CT 04/17/2017. FINDINGS: Alignment: Stable straightening of cervical lordosis since 2019. Cervicothoracic junction alignment is within normal limits. Bilateral posterior element alignment is within normal limits. Skull base and vertebrae: Visualized skull base is intact. No atlanto-occipital dissociation. C1 and C2  appear intact and normally aligned. No acute osseous abnormality identified. Soft tissues and spinal canal: No prevertebral fluid or swelling. No visible canal hematoma. Other soft tissue findings on CTA reported separately. Disc levels: Stable cervical spine degeneration since 2019. Probable mild spinal stenosis at C3-C4. Upper chest: Visible upper thoracic levels appear intact. See also upper chest findings on CTA neck. IMPRESSION: 1. No acute traumatic injury identified in the cervical spine. 2. Stable cervical spine degeneration since 2019. 3. See also chest findings on CTA Neck reported separately. Electronically Signed   By: Genevie Ann M.D.   On: 09/21/2020 04:18   CT HEAD CODE STROKE WO CONTRAST  Result Date: 09/21/2020 CLINICAL DATA:  Code stroke. Initial evaluation for acute stroke, left facial droop. EXAM: CT HEAD WITHOUT CONTRAST TECHNIQUE: Contiguous axial images were obtained from the base of the skull through the vertex without intravenous contrast. COMPARISON:  Prior CT from 06/28/2020. FINDINGS: Brain: Generalized age appropriate cerebral atrophy with mild chronic small vessel ischemic disease. No acute intracranial hemorrhage. No acute large vessel territory infarct. No mass lesion, midline shift or mass effect. No hydrocephalus. No visible extra-axial fluid collection. Vascular: No hyperdense vessel. Scattered vascular calcifications noted within the carotid siphons. Skull: Soft tissue contusion present at the right forehead and periorbital region. No calvarial fracture. Sinuses/Orbits: Globes and orbital soft tissues demonstrate no other acute finding. Scattered mucosal thickening noted within the ethmoidal air cells and left maxillary sinus. Paranasal sinuses are otherwise clear. No mastoid effusion. Other: None. ASPECTS [facility] Stroke Program Early CT Score) - Ganglionic level infarction (caudate, lentiform nuclei, internal capsule, insula, M1-M3 cortex): 7 - Supraganglionic infarction (M4-M6  cortex): 3 Total score (0-10 with 10 being normal): 10 IMPRESSION: 1. No acute intracranial abnormality. 2. ASPECTS is 10. 3. Right frontal/periorbital scalp contusion. No calvarial fracture. These results were communicated to Dr. Rory Percy at 3:03 am on 09/21/2020 by text page via the [facility] messaging system. Electronically Signed   By: Jeannine Boga M.D.   On: 09/21/2020 03:05   CT ANGIO HEAD NECK W WO CM W PERF (CODE STROKE)  Result Date: 09/21/2020 CLINICAL DATA:  80 year old female code stroke presentation. Left facial droop. Fall. EXAM: CT ANGIOGRAPHY HEAD AND NECK CT PERFUSION BRAIN TECHNIQUE: Multidetector CT imaging of the head and neck was performed using the standard protocol during bolus administration of intravenous contrast. Multiplanar CT image reconstructions and MIPs were obtained to evaluate the vascular anatomy. Carotid stenosis measurements (when applicable) are obtained utilizing NASCET criteria, using the distal internal carotid diameter as the denominator. Multiphase CT imaging of the brain was performed following IV bolus contrast injection. Subsequent parametric perfusion maps were calculated using RAPID software. CONTRAST:  177m OMNIPAQUE IOHEXOL 350 MG/ML SOLN COMPARISON:  Plain head CT 0247 hours. FINDINGS: CT Brain Perfusion Findings: ASPECTS: 10 CBF (<30%) Volume: 075mPerfusion (Tmax>6.0s) volume: 92m59mismatch Volume: Not applicable Infarction Location:Not applicable CTA NECK Skeleton: Dedicated CT face and CT cervical spine are reported separately. Absent dentition. No acute osseous abnormality identified. Upper chest: Intubated. Endotracheal tube tip in good position above the  carina. Partially visible confluent left lower lobe consolidation. Additional streaky peribronchial opacity in the dependent upper lobes. Mild, mild, reactive appearing lymph nodes in the visible mediastinum. Grossly patent visible central pulmonary arteries. Other neck: Sequelae of thyroidectomy. Enteric  tube in place and courses into the thoracic esophagus. Intubated. Fluid in the pharynx. Parapharyngeal and retropharyngeal spaces remain within normal limits. Mild mass effect on the sublingual space. Submandibular and parotid spaces remain normal. No lymphadenopathy. Aortic arch: 3 vessel arch configuration.  No arch atherosclerosis. Right carotid system: Mildly tortuous brachiocephalic artery and proximal right CCA with minimal plaque and no stenosis. Capacious right carotid bifurcation. No cervical right ICA plaque or stenosis. Left carotid system: Negative aside from tortuosity. Vertebral arteries: Mild plaque in the proximal right subclavian artery without stenosis. Normal right vertebral artery origin. Patent right vertebral artery with mild V2 segment tortuosity. No plaque or stenosis. Negative proximal left subclavian artery. Normal left vertebral artery origin. Tortuous V1 segment. Mildly dominant left vertebral is patent to the skull base without plaque or stenosis. CTA HEAD Posterior circulation: Mildly dominant left V4 segment. Patent distal vertebrals and vertebrobasilar junction with no plaque or stenosis. Normal PICA origins. Patent basilar artery is diminutive but without stenosis. Patent SCA origins. Fetal type bilateral PCA origins. Bilateral PCA branches are within normal limits. Anterior circulation: Both ICA siphons are patent with mild calcified plaque and no stenosis. Normal ophthalmic and posterior communicating artery origins. Patent carotid termini. Patent MCA and ACA origins. Normal anterior communicating artery. The right ACA A2 is mildly dominant. ACA branches are within normal limits. Left MCA M1 segment bifurcates early without stenosis. Right MCA M1 also bifurcates early without stenosis. Bilateral MCA branches are within normal limits. Venous sinuses: Patent. Anatomic variants: Mildly dominant left vertebral artery. Fetal type bilateral PCA origins. Mildly dominant right ACA A2.  Other findings: Broad-based right forehead and periorbital scalp hematoma again noted. Review of the MIP images confirms the above findings IMPRESSION: 1. Negative for large vessel occlusion, negative CT Perfusion, and minimal atherosclerosis in the head and neck. No arterial stenosis identified. 2. Intubated and enteric tube in place with no adverse features. 3. Left lower lobe consolidation which could be pneumonia or aspiration. Streaky upper lobe peribronchial opacity which could be atelectasis or infection. 4. Dedicated CT face and cervical spine are reported separately. Electronically Signed   By: Genevie Ann M.D.   On: 09/21/2020 04:08   CT Maxillofacial Wo Contrast  Result Date: 09/21/2020 CLINICAL DATA:  80 year old female code stroke presentation. Left facial droop. Fall. EXAM: CT MAXILLOFACIAL WITHOUT CONTRAST TECHNIQUE: Multidetector CT imaging of the maxillofacial structures was performed. Multiplanar CT image reconstructions were also generated. COMPARISON:  CT head, CTA head and neck today reported separately. Prior face CT 09/13/2019. FINDINGS: Osseous: Chronically absent dentition. Intubated and oral enteric tube in place. Mandible intact and normally located. No maxilla, zygoma, or pterygoid fracture. Nasal bones appear stable and intact. Central skull base appears stable and intact. Cervical spine is detailed separately. Visible calvarium intact. Orbits: Intact orbital walls. Globes and intraorbital soft tissues appears stable and intact. Bulky right forehead and preseptal periorbital hematoma on the right, similar to contralateral left side injury seen last year. Sinuses: Trace fluid layering in the left maxillary sinus. Mild bilateral ethmoid mucosal thickening. Otherwise well aerated. Tympanic cavities and mastoids remain clear. Soft tissues: Intubated and oral enteric tube in place with fluid in the pharynx. Otherwise negative visible noncontrast deep soft tissue spaces of the face. Limited  intracranial:  Stable to that reported separately. IMPRESSION: 1. Bulky right forehead and right periorbital hematoma. No underlying facial fracture or other acute traumatic injury identified. 2. Intubated and oral enteric tube in place. Electronically Signed   By: Genevie Ann M.D.   On: 09/21/2020 04:14    Microbiology: Recent Results (from the past 240 hour(s))  Resp Panel by RT-PCR (Flu A&B, Covid) Nasopharyngeal Swab     Status: None   Collection Time: 09/21/20  2:26 AM   Specimen: Nasopharyngeal Swab; Nasopharyngeal(NP) swabs in vial transport medium  Result Value Ref Range Status   SARS Coronavirus 2 by RT PCR NEGATIVE NEGATIVE Final    Comment: (NOTE) SARS-CoV-2 target nucleic acids are NOT DETECTED.  The SARS-CoV-2 RNA is generally detectable in upper respiratory specimens during the acute phase of infection. The lowest concentration of SARS-CoV-2 viral copies this assay can detect is 138 copies/mL. A negative result does not preclude SARS-Cov-2 infection and should not be used as the sole basis for treatment or other patient management decisions. A negative result may occur with  improper specimen collection/handling, submission of specimen other than nasopharyngeal swab, presence of viral mutation(s) within the areas targeted by this assay, and inadequate number of viral copies(<138 copies/mL). A negative result must be combined with clinical observations, patient history, and epidemiological information. The expected result is Negative.  Fact Sheet for Patients:  EntrepreneurPulse.com.au  Fact Sheet for Healthcare Providers:  IncredibleEmployment.be  This test is no t yet approved or cleared by the [place] FDA and  has been authorized for detection and/or diagnosis of SARS-CoV-2 by FDA under an Emergency Use Authorization (EUA). This EUA will remain  in effect (meaning this test can be used) for the duration of the COVID-19 declaration  under Section 564(b)(1) of the Act, 21 U.S.C.section 360bbb-3(b)(1), unless the authorization is terminated  or revoked sooner.       Influenza A by PCR NEGATIVE NEGATIVE Final   Influenza B by PCR NEGATIVE NEGATIVE Final    Comment: (NOTE) The Xpert Xpress SARS-CoV-2/FLU/RSV plus assay is intended as an aid in the diagnosis of influenza from Nasopharyngeal swab specimens and should not be used as a sole basis for treatment. Nasal washings and aspirates are unacceptable for Xpert Xpress SARS-CoV-2/FLU/RSV testing.  Fact Sheet for Patients: EntrepreneurPulse.com.au  Fact Sheet for Healthcare Providers: IncredibleEmployment.be  This test is not yet approved or cleared by the [place] FDA and has been authorized for detection and/or diagnosis of SARS-CoV-2 by FDA under an Emergency Use Authorization (EUA). This EUA will remain in effect (meaning this test can be used) for the duration of the COVID-19 declaration under Section 564(b)(1) of the Act, 21 U.S.C. section 360bbb-3(b)(1), unless the authorization is terminated or revoked.  Performed at [facility], [place] [address], [place], [place] [address]   MRSA Next Gen by PCR, Nasal     Status: Abnormal   Collection Time: 09/21/20  5:32 AM   Specimen: Nasal Mucosa; Nasal Swab  Result Value Ref Range Status   MRSA by PCR Next Gen DETECTED (A) NOT DETECTED Final    Comment: RESULT CALLED TO, READ BACK BY AND VERIFIED WITH: RN N PEARSE [id number] AT 55 AM BY CM (NOTE) The GeneXpert MRSA Assay (FDA approved for NASAL specimens only), is one component of a comprehensive MRSA colonization surveillance program. It is not intended to diagnose MRSA infection nor to guide or monitor treatment for MRSA infections. Test performance is not FDA approved in patients less than 2  years old. Performed at [facility], [place] [address], [place], [place] [address]   Culture, Respiratory w Gram  Stain     Status: None   Collection Time: 09/21/20 10:27 AM   Specimen: Tracheal Aspirate; Respiratory  Result Value Ref Range Status   Specimen Description TRACHEAL ASPIRATE  Final   Special Requests NONE  Final   Gram Stain   Final    ABUNDANT WBC PRESENT, PREDOMINANTLY PMN ABUNDANT GRAM POSITIVE COCCI FEW GRAM NEGATIVE RODS RARE YEAST    Culture   Final    ABUNDANT Consistent with normal respiratory flora. No Pseudomonas species isolated Performed at [facility] [address], [place], [place] [address]    Report Status 09/23/2020 FINAL  Final  Culture, blood (routine x 2)     Status: None   Collection Time: 09/21/20 11:21 AM   Specimen: BLOOD LEFT HAND  Result Value Ref Range Status   Specimen Description BLOOD LEFT HAND  Final   Special Requests   Final    BOTTLES DRAWN AEROBIC AND ANAEROBIC Blood Culture adequate volume   Culture   Final    NO GROWTH 5 DAYS Performed at [facility], [place] [address], [place], [place] [address]    Report Status 09/26/2020 FINAL  Final  Culture, blood (routine x 2)     Status: None   Collection Time: 09/21/20 11:31 AM   Specimen: BLOOD LEFT HAND  Result Value Ref Range Status   Specimen Description BLOOD LEFT HAND  Final   Special Requests   Final    BOTTLES DRAWN AEROBIC ONLY Blood Culture adequate volume   Culture   Final    NO GROWTH 5 DAYS Performed at [facility], [place] [address], [place], [place] [address]    Report Status 09/26/2020 FINAL  Final  Urine Culture     Status: None   Collection Time: 09/21/20  1:33 PM   Specimen: Urine, Catheterized  Result Value Ref Range Status   Specimen Description URINE, CATHETERIZED  Final   Special Requests NONE  Final   Culture   Final    NO GROWTH Performed at [facility] [address] [place],  [address]    Report Status 09/22/2020 FINAL  Final     Labs: Basic Metabolic Panel: Recent Labs  Lab 09/21/20 0500 09/21/20 0855 09/22/20 1615  09/23/20 0334 09/23/20 1310 09/23/20 1826 09/24/20 0413 09/26/20 0257 09/27/20 0241  NA 139   < >  --  136 139  --  141 136 136  K 5.2*   < >  --  2.9* 4.3  --  4.1 3.1* 3.2*  CL 112*   < >  --  109 112*  --  109 103 100  CO2 22   < >  --  20* 21*  --  21* 22 25  GLUCOSE 168*   < >  --  152* 119*  --  115* 92 103*  BUN 21   < >  --  31* 27*  --  _0 CREATININE 0.85   < >  --  1.01* 0.94  --  0.82 0.89 0.78  CALCIUM 8.0*   < >  --  7.1* 7.6*  --  7.9* 8.3* 8.6*  MG 1.8  --  2.2 2.2  --  2.1 2.2 1.9 1.9  PHOS 3.2  --  2.9 2.4*  --  2.8 2.9  --   --    < > =  values in this interval not displayed.   Liver Function Tests: Recent Labs  Lab 09/21/20 0224 09/22/20 0833  AST 34 49*  ALT 22 37  ALKPHOS 149* 97  BILITOT 0.7 1.1  PROT 7.1 4.9*  ALBUMIN 3.8 2.3*   No results for input(s): LIPASE, AMYLASE in the last 168 hours. No results for input(s): AMMONIA in the last 168 hours. CBC: Recent Labs  Lab 09/21/20 0224 09/21/20 0305 09/22/20 1616 09/23/20 0334 09/24/20 0413 09/26/20 0302 09/27/20 0241  WBC 10.6*   < > 13.4* 11.1* 10.8* 6.5 6.1  NEUTROABS 8.1*  --   --   --   --  4.1 3.6  HGB 8.6*   < > 7.4* 7.0* 7.2* 7.3* 8.3*  HCT 28.3*   < > 22.8* 22.0* 23.0* 22.3* 25.8*  MCV 120.9*   < > 108.1* 108.4* 111.1* 105.7* 104.9*  PLT 202   < > 147* 136* 136* 199 247   < > = values in this interval not displayed.   Cardiac Enzymes: No results for input(s): CKTOTAL, CKMB, CKMBINDEX, TROPONINI in the last 168 hours. BNP: BNP (last 3 results) No results for input(s): BNP in the last 8760 hours.  ProBNP (last 3 results) No results for input(s): PROBNP in the last 8760 hours.  CBG: Recent Labs  Lab 09/23/20 1536 09/23/20 1945 09/23/20 2314 09/24/20 0304 09/24/20 0735  GLUCAP 110* 126* 114* 113* 119*       Signed:  Nita Sells MD   [facility] 09/27/2020, 11:16 AM

## 2020-09-27 NOTE — Hospital Course (Signed)
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  Right frontal periorbital scalp contusion without any evidence of calvarial fracture CT-angio head neck 7/28 >> no evidence for large vessel occlusion, perfusion abnormality CT C-spine 7/28 >> no acute C-spine trauma, stable C-spine degeneration since 2019 Chest x-ray 7/28 >> ET tube in position, left perihilar infiltrate   1.  Acute metabolic encephalopathy. Chronic pain syndrome. Polypharmacy. Initially admitted to the ICU. Was intubated. EEG CTA and CT head negative for any acute abnormality. Currently on Lyrica Elavil and trazodone. Lower dose at home regimen. Holding Flexeril Percocet and Xanax. Palliative care consulted for symptom management and further assistance.  Appreciate assistance.   2.  Acute hypoxic respiratory failure requiring intubation. Aspiration pneumonia Continue with IV antibiotics. Oxygenation improving currently on room air. Extubated with Monitor.  Cultures so far negative.     3.  Acute on chronic anemia. SP 1 PRBC transfusion on 7/29. Hemoglobin remained stable.  Self suspect the acute anemia is likely dilutional in nature.  Monitor.   4.  Hypokalemia Replaced.   5.  Essential hypertension Blood pressure elevated. Increase losartan dose as well as add hydralazine. Labetalol as needed.   6.  Excessive secretion Started on bethanechol. Will continue for today and discontinue tomorrow.

## 2020-09-27 NOTE — Progress Notes (Signed)
Physical Therapy Treatment Patient Details Name: Cynthia Benson MRN: [id number] DOB: [birth year] Today's Date: 09/27/2020    History of Present Illness Pt is an 80 yo female presenting as code stroke after a fall with R periorbital frontal hematoma. Encephalopathy suspected to be from polypharmacy with CTH negative for acute findings. Pt had an episode of emesis with EMS requiring urgent intubation (ETT 7/28-7/30). CXR concerning for aspiration. PMH includes: HTN, hypothyroidism, chronic back pain with narcotic use, pt reported h/o reflux    PT Comments    Pt progressing steadily towards her physical therapy goals. Ambulating 100 ft x 2 with a walker and seated rest break in between. Pt able to self cue for activity pacing. Discussed fall prevention strategies in regards to home set up I.e. eliminating obstacles in pathways, throw rugs, having night light/lamp on at night. D/c plan remains appropriate.     Follow Up Recommendations  Home health PT;Supervision for mobility/OOB     Equipment Recommendations  None recommended by PT    Recommendations for Other Services       Precautions / Restrictions Precautions Precautions: Fall Restrictions Weight Bearing Restrictions: No    Mobility  Bed Mobility               General bed mobility comments: OOB in chair    Transfers Overall transfer level: Needs assistance Equipment used: None Transfers: Sit to/from Stand Sit to Stand: Supervision         General transfer comment: supervision for safety  Ambulation/Gait Ambulation/Gait assistance: Supervision Gait Distance (Feet): 200 Feet (100 ft x 2) Assistive device: Rolling walker (2 wheeled) Gait Pattern/deviations: Step-through pattern;Decreased stride length;Trunk flexed Gait velocity: decreased   General Gait Details: Slow and steady pace, no gross imbalance noted. able to self cue for activity pacing   Stairs             Wheelchair Mobility     Modified Rankin (Stroke Patients Only) Modified Rankin (Stroke Patients Only) Pre-Morbid Rankin Score: No significant disability Modified Rankin: Moderately severe disability     Balance Overall balance assessment: Needs assistance Sitting-balance support: Feet supported Sitting balance-Leahy Scale: Good     Standing balance support: No upper extremity supported;During functional activity Standing balance-Leahy Scale: Fair                              Cognition Arousal/Alertness: Awake/alert Behavior During Therapy: WFL for tasks assessed/performed Overall Cognitive Status: Within Functional Limits for tasks assessed                                        Exercises      General Comments        Pertinent Vitals/Pain Pain Assessment: Faces Faces Pain Scale: Hurts a little bit Pain Location: back, head Pain Descriptors / Indicators: Discomfort;Guarding;Headache Pain Intervention(s): Monitored during session    Home Living                      Prior Function            PT Goals (current goals can now be found in the care plan section) Acute Rehab PT Goals Patient Stated Goal: less pain PT Goal Formulation: With patient Time For Goal Achievement: 10/08/20 Potential to Achieve Goals: Good Progress towards PT goals: Progressing toward goals  Frequency    Min 4X/week      PT Plan Current plan remains appropriate    Co-evaluation              AM-PAC PT "6 Clicks" Mobility   Outcome Measure  Help needed turning from your back to your side while in a flat bed without using bedrails?: None Help needed moving from lying on your back to sitting on the side of a flat bed without using bedrails?: None Help needed moving to and from a bed to a chair (including a wheelchair)?: A Little Help needed standing up from a chair using your arms (e.g., wheelchair or bedside chair)?: A Little Help needed to walk in hospital  room?: A Little Help needed climbing 3-5 steps with a railing? : A Lot 6 Click Score: 19    End of Session Equipment Utilized During Treatment: Gait belt Activity Tolerance: Patient tolerated treatment well Patient left: in chair;with call bell/phone within reach;with chair alarm set Nurse Communication: Mobility status PT Visit Diagnosis: Unsteadiness on feet (R26.81);Muscle weakness (generalized) (M62.81);History of falling (Z91.81)     Time: [id number] PT Time Calculation (min) (ACUTE ONLY): 19 min  Charges:  $Therapeutic Activity: 8-22 mins                     [name], PT, [name] Acute Rehabilitation Services Pager [phone] Office [phone]    Deno Etienne 09/27/2020, 11:19 AM

## 2020-09-28 LAB — PATHOLOGIST SMEAR REVIEW: Path Review: INCREASED

## 2020-10-12 ENCOUNTER — Telehealth: Payer: Self-pay | Admitting: Oncology

## 2020-10-12 NOTE — Telephone Encounter (Signed)
Patient is currently est w/Dr Hinton Rao Patient was re-referred by Dr Cher Nakai for Iron Def/Anemia  Placing patient referral in [place] - Have made several attempts to contact patient with NO Success.  7/29 Spouse stated she was Inpatient at [facility]  Call several times after the above call with No Success  Mailed Unreachable Letter

## 2020-10-12 NOTE — Telephone Encounter (Signed)
Notified Romie Minus at Dr Max Sane that patient is Unreachable to schedule an Appt

## 2020-11-14 ENCOUNTER — Telehealth: Payer: Self-pay | Admitting: Oncology

## 2020-11-14 NOTE — Telephone Encounter (Signed)
Patient referred by Dr Cher Nakai to be Re-Est for Dx: Anemia.  Unable to Leave Message for patient to return call to schedule an Appt

## 2020-11-30 ENCOUNTER — Telehealth: Payer: Self-pay | Admitting: Oncology

## 2020-11-30 NOTE — Telephone Encounter (Signed)
Per Dr Cher Nakai, patient scheduled for 12/14/20 Labs 3:45 pm - Follow Up 4:15 pm.  Mailed Appt Summary

## 2020-12-08 NOTE — Progress Notes (Signed)
[facility]  [address] [place],  [place]  [address] [phone]  Clinic Day:  12/14/2020  Referring physician: Lou Miner, MD  This document serves as a record of services personally performed by Cynthia Poisson, MD. It was created on their behalf by Curry,Lauren E, a trained medical scribe. The creation of this record is based on the scribe's personal observations and the provider's statements to them.  CHIEF COMPLAINT:  CC: Anemia  Current Treatment:  Intermittent transfusions   HISTORY OF PRESENT ILLNESS:  Cynthia Benson is a 80 y.o. female referred by Dr. Cher Nakai for evaluation of her anemia.  She describes being anemic for years.  She was found to be anemic at Dr. Marguerita Beards office in October of 2019 where her hemoglobin was 10.1 with an MCV of 106.  White count and platelets were within the normal range at that time.  Most recent lab work up from Dr. Marguerita Beards office in October 2020 revealed a low hemoglobin of 8.6 with an MCV of 97.  White count and platelets were normal.  Iron studies revealed an iron level of 45 with a low TIBC of 191 for a percent saturation of 24%.  Serum ferritin was elevated at 327 and B12 level was elevated at 1570.  Folate level was within the normal range at 6.9.  She states that she has known she was anemic for several years.  She has had transfusions in the past. She started menarche around age 71, and has had 5 pregnancies with 5 live births.  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   INTERVAL HISTORY:  [name] is here for follow up after missing several scheduled appointements. She had 4 units of PRBCs in April for a hemoglobin of 7.8, and by May her hemoglobin was 10.9. She also had transfusions last year. Etiology of her anemia has never been found. She reports chronic fatigue. She denies pica to ice, mouth soreness or melena. She states  that she has had 3 bleeding ulcers in the past many years ago. She continues oral iron and B12 supplement, but we will check levels today. She has chronic pain of the back and legs and had previously been on morphine, but is now on Lyrica. Hemoglobin has dropped from 10.9 in May to 7.5 with an MCV of 108, and white count and platelets are normal. Chemistries are unremarkable except for  BUN of 42, a creatinine of 1.2, stable, and a calcium of 8.2. Her  appetite is good, and she is eating well.  She denies fever, chills or other signs of infection.  She denies nausea, vomiting, bowel issues, or abdominal pain.  She denies sore throat, cough, dyspnea, or chest pain.  REVIEW OF SYSTEMS:  Review of Systems  Constitutional:  Positive for fatigue (chronic). Negative for appetite change, chills, fever and unexpected weight change.  HENT:  Negative.    Eyes: Negative.   Respiratory: Negative.  Negative for chest tightness, cough, hemoptysis, shortness of breath and wheezing.   Cardiovascular: Negative.  Negative for chest pain, leg swelling and palpitations.  Gastrointestinal: Negative.  Negative for abdominal distention, abdominal pain, blood in stool, constipation, diarrhea, nausea and vomiting.  Endocrine: Negative.   Genitourinary: Negative.  Negative for difficulty urinating, dysuria, frequency and hematuria.   Musculoskeletal:  Positive for back pain (chronic). Negative for arthralgias, flank pain, gait problem and myalgias.  Chronic leg pain  Skin: Negative.   Neurological: Negative.  Negative for dizziness, extremity weakness, gait problem, headaches, light-headedness, numbness, seizures and speech difficulty.  Hematological: Negative.   Psychiatric/Behavioral: Negative.  Negative for depression and sleep disturbance. The patient is not nervous/anxious.     VITALS:  Blood pressure (!) 152/67, pulse 65, temperature 98.4 F (36.9 C), temperature source Oral, resp. rate 18, height 5\' 2"  (1.575  m), weight 141 lb (64 kg), SpO2 96 %.  Wt Readings from Last 3 Encounters:  12/14/20 141 lb (64 kg)  09/27/20 156 lb 4.9 oz (70.9 kg)    Body mass index is 25.79 kg/m.  Performance status (ECOG): 1 - Symptomatic but completely ambulatory  PHYSICAL EXAM:  Physical Exam Constitutional:      General: She is not in acute distress.    Appearance: Normal appearance. She is normal weight.  HENT:     Head: Normocephalic and atraumatic.  Eyes:     General: No scleral icterus.    Extraocular Movements: Extraocular movements intact.     Conjunctiva/sclera: Conjunctivae normal.     Pupils: Pupils are equal, round, and reactive to light.  Cardiovascular:     Rate and Rhythm: Normal rate and regular rhythm.     Pulses: Normal pulses.     Heart sounds: Normal heart sounds. No murmur heard.   No friction rub. No gallop.  Pulmonary:     Effort: Pulmonary effort is normal. No respiratory distress.     Breath sounds: Normal breath sounds.  Abdominal:     General: Bowel sounds are normal. There is no distension.     Palpations: Abdomen is soft. There is no hepatomegaly, splenomegaly or mass.     Tenderness: There is no abdominal tenderness.  Musculoskeletal:        General: Normal range of motion.     Cervical back: Normal range of motion and neck supple.     Right lower leg: No edema.     Left lower leg: No edema.  Lymphadenopathy:     Cervical: No cervical adenopathy.  Skin:    General: Skin is warm and dry.     Coloration: Skin is pale.  Neurological:     General: No focal deficit present.     Mental Status: She is alert and oriented to person, place, and time. Mental status is at baseline.  Psychiatric:        Mood and Affect: Mood normal.        Behavior: Behavior normal.        Thought Content: Thought content normal.        Judgment: Judgment normal.    LABS:   CBC Latest Ref Rng & Units 12/14/2020 09/27/2020 09/26/2020  WBC - 5.4 6.1 6.5  Hemoglobin 12.0 - 16.0 7.5(A) 8.3(L)  7.3(L)  Hematocrit 36 - 46 160(A) 25.8(L) 22.3(L)  Platelets 150 - 400 K/uL - 247 199   CMP Latest Ref Rng & Units 12/14/2020 09/27/2020 09/26/2020  Glucose 70 - 99 mg/dL - 103(H) 92  BUN 4 - 21 42(A) 11 15  Creatinine 0.5 - 1.1 1.2(A) 0.78 0.89  Sodium 137 - 147 139 136 136  Potassium 3.4 - 5.3 4.7 3.2(L) 3.1(L)  Chloride 99 - 108 110(A) 100 103  CO2 13 - 22 19 25 22   Calcium 8.7 - 10.7 8.2(A) 8.6(L) 8.3(L)  Total Protein 6.5 - 8.1 g/dL - - -  Total Bilirubin 0.3 - 1.2 mg/dL - - -  Alkaline Phos 25 -  125 143(A) - -  AST 13 - 35 24 - -  ALT 7 - 35 12 - -   No results found for: TOTALPROTELP, ALBUMINELP, A1GS, A2GS, BETS, BETA2SER, GAMS, MSPIKE, SPEI Lab Results  Component Value Date   TIBC 214 (L) 09/26/2020   FERRITIN 615 (H) 09/26/2020   IRONPCTSAT 36 (H) 09/26/2020   No results found for: LDH  STUDIES:  No results found.    HISTORY:   Allergies:  Allergies  Allergen Reactions   Moxifloxacin Rash    Current Medications: Current Outpatient Medications  Medication Sig Dispense Refill   amLODipine (NORVASC) 5 MG tablet Take 1 tablet (5 mg total) by mouth daily. 30 tablet 0   FEROSUL 325 (65 Fe) MG tablet Take 325 mg by mouth 2 (two) times daily.     folic acid (FOLVITE) 1 MG tablet Take 1 mg by mouth daily.     lansoprazole (PREVACID) 30 MG capsule Take 30 mg by mouth 2 (two) times daily.     losartan (COZAAR) 25 MG tablet Take 25 mg by mouth daily.     naloxone (NARCAN) nasal spray 4 mg/0.1 mL See admin instructions.     pregabalin (LYRICA) 50 MG capsule Take 50 mg by mouth 3 (three) times daily.     SYNTHROID 100 MCG tablet Take 100 mcg by mouth daily.     traZODone (DESYREL) 50 MG tablet Take 50 mg by mouth at bedtime as needed for sleep.     No current facility-administered medications for this visit.     ASSESSMENT & PLAN:   Assessment/Plan:  Lynden Carrithers is a 80 y.o. female with anemia.  No etiology has been found, and so this may represent  anemia of chronic disease, chronic GI blood loss or be myelophthisic in nature. Her hemoglobin has dropped again and she is symptomatic, so I do recommend transfusion. She would like to have this next week and she will call us tomorrow to schedule this. I assured her that this is not urgent and her level has slowly dropped over the past several months. Likely, she will require intermittent transfusions, but this does not appear to be very frequent. As Retocrit injections are expensive and would require multiple trips to the infusion center, I do not recommend these since she has already missed several scheduled appointments. I will notify her of the other pending lab results, and forward them to Dr. Truman Hayward. He will be following her closely and can call if she has a significant decrease in her hemoglobin. She knows to continue oral iron and B12 supplements daily. Dr. Truman Hayward see's her fairly regularly, and so I will see her back in 6 months with CBC, CMP and type and cross match for repeat clinical assessment. She understands and agrees with this plan of care.  I have answered their questions and they know to call with any concerns.   I provided 30 minutes of face-to-face time during this this encounter and > 50% was spent counseling as documented under my assessment and plan.    Derwood Kaplan, MD [facility] AT [facility] [address] [place] [address] Dept: [phone] Dept Fax: [phone]   I, Rita Ohara, am acting as scribe for Derwood Kaplan, MD  I have reviewed this report as typed by the medical scribe, and it is complete and accurate.  Hermina Barters

## 2020-12-14 ENCOUNTER — Other Ambulatory Visit: Payer: Self-pay | Admitting: Hematology and Oncology

## 2020-12-14 ENCOUNTER — Inpatient Hospital Stay (INDEPENDENT_AMBULATORY_CARE_PROVIDER_SITE_OTHER): Payer: Medicare Other | Admitting: Oncology

## 2020-12-14 ENCOUNTER — Encounter: Payer: Self-pay | Admitting: Oncology

## 2020-12-14 ENCOUNTER — Other Ambulatory Visit: Payer: Self-pay | Admitting: Oncology

## 2020-12-14 ENCOUNTER — Inpatient Hospital Stay: Payer: Medicare Other | Attending: Oncology

## 2020-12-14 DIAGNOSIS — D649 Anemia, unspecified: Secondary | ICD-10-CM | POA: Diagnosis present

## 2020-12-14 DIAGNOSIS — D72819 Decreased white blood cell count, unspecified: Secondary | ICD-10-CM | POA: Diagnosis not present

## 2020-12-14 DIAGNOSIS — E039 Hypothyroidism, unspecified: Secondary | ICD-10-CM | POA: Diagnosis not present

## 2020-12-14 LAB — BASIC METABOLIC PANEL
BUN: 42 — AB (ref 4–21)
CO2: 19 (ref 13–22)
Chloride: 110 — AB (ref 99–108)
Creatinine: 1.2 — AB (ref 0.5–1.1)
Glucose: 95
Potassium: 4.7 (ref 3.4–5.3)
Sodium: 139 (ref 137–147)

## 2020-12-14 LAB — CBC AND DIFFERENTIAL
HCT: 160 — AB (ref 36–46)
Hemoglobin: 7.5 — AB (ref 12.0–16.0)
Neutrophils Absolute: 3.19
WBC: 5.4

## 2020-12-14 LAB — HEPATIC FUNCTION PANEL
ALT: 12 (ref 7–35)
AST: 24 (ref 13–35)
Alkaline Phosphatase: 143 — AB (ref 25–125)
Bilirubin, Total: 0.6

## 2020-12-14 LAB — IRON AND TIBC
Iron: 183 ug/dL — ABNORMAL HIGH (ref 28–170)
Saturation Ratios: 66 % — ABNORMAL HIGH (ref 10.4–31.8)
TIBC: 277 ug/dL (ref 250–450)
UIBC: 94 ug/dL

## 2020-12-14 LAB — CBC
MCV: 108 — AB (ref 81–99)
RBC: 2.1 — AB (ref 3.87–5.11)

## 2020-12-14 LAB — COMPREHENSIVE METABOLIC PANEL
Albumin: 4.1 (ref 3.5–5.0)
Calcium: 8.2 — AB (ref 8.7–10.7)

## 2020-12-14 LAB — FOLATE: Folate: 40.6 ng/mL (ref >5.9)

## 2020-12-14 LAB — FERRITIN: Ferritin: 313 ng/mL — ABNORMAL HIGH (ref 11–307)

## 2020-12-14 LAB — VITAMIN B12: Vitamin B-12: 802 pg/mL (ref 180–914)

## 2020-12-14 NOTE — Progress Notes (Deleted)
HEMATOLOGY-ONCOLOGY ELECTRONIC VISIT PROGRESS NOTE  Patient Care Team: Lou Miner, MD as PCP - General (Internal Medicine)  I connected with the patient via telephone conference and verified that I am speaking with the correct person using two identifiers. The patient's location is at home and I am providing care from the [facility] I discussed the limitations, risks, security and privacy concerns of performing an evaluation and management service by e-visits and the availability of in person appointments.  I also discussed with the patient that there may be a patient responsible charge related to this service. The patient expressed understanding and agreed to proceed.   ASSESSMENT & PLAN:  No problem-specific Assessment & Plan notes found for this encounter.   No orders of the defined types were placed in this encounter.   INTERVAL HISTORY: Please see below for problem oriented charting. The purpose of today's discussion is ***   SUMMARY OF ONCOLOGIC HISTORY: Oncology History   No history exists.    REVIEW OF SYSTEMS:   Constitutional: Denies fevers, chills or abnormal weight loss Eyes: Denies blurriness of vision Ears, nose, mouth, throat, and face: Denies mucositis or sore throat Respiratory: Denies cough, dyspnea or wheezes Cardiovascular: Denies palpitation, chest discomfort Gastrointestinal:  Denies nausea, heartburn or change in bowel habits Skin: Denies abnormal skin rashes Lymphatics: Denies new lymphadenopathy or easy bruising Neurological:Denies numbness, tingling or new weaknesses Behavioral/Psych: Mood is stable, no new changes  Extremities: No lower extremity edema All other systems were reviewed with the patient and are negative.  I have reviewed the past medical history, past surgical history, social history and family history with the patient and they are unchanged from previous note.  ALLERGIES:  is allergic to moxifloxacin.  MEDICATIONS:   Current Outpatient Medications  Medication Sig Dispense Refill   acetaminophen (TYLENOL) 325 MG tablet Take 2 tablets (650 mg total) by mouth 3 (three) times daily.     amLODipine (NORVASC) 5 MG tablet Take 1 tablet (5 mg total) by mouth daily. 30 tablet 0   FEROSUL 325 (65 Fe) MG tablet Take 325 mg by mouth 2 (two) times daily.     folic acid (FOLVITE) 1 MG tablet Take 1 mg by mouth daily.     lansoprazole (PREVACID) 30 MG capsule Take 30 mg by mouth 2 (two) times daily.     latanoprost (XALATAN) 0.005 % ophthalmic solution Place 1 drop into the right eye at bedtime.     losartan (COZAAR) 25 MG tablet Take 25 mg by mouth daily.     naloxone (NARCAN) nasal spray 4 mg/0.1 mL See admin instructions.     pregabalin (LYRICA) 25 MG capsule Take 1 capsule (25 mg total) by mouth 3 (three) times daily. 90 capsule 0   SYNTHROID 100 MCG tablet Take 100 mcg by mouth daily.     traZODone (DESYREL) 50 MG tablet Take 50 mg by mouth at bedtime as needed for sleep.     No current facility-administered medications for this visit.    PHYSICAL EXAMINATION: ECOG PERFORMANCE STATUS: {CHL ONC ECOG [id number]  LABORATORY DATA:  I have reviewed the data as listed CMP Latest Ref Rng & Units 09/27/2020 09/26/2020 09/24/2020  Glucose 70 - 99 mg/dL 103(H) 92 115(H)  BUN 8 - 23 mg/dL 11 15 20   Creatinine 0.44 - 1.00 mg/dL 0.78 0.89 0.82  Sodium 135 - 145 mmol/L 136 136 141  Potassium 3.5 - 5.1 mmol/L 3.2(L) 3.1(L) 4.1  Chloride 98 - 111 mmol/L 100 103 109  CO2 22 - 32 mmol/L 25 22 21(L)  Calcium 8.9 - 10.3 mg/dL 8.6(L) 8.3(L) 7.9(L)  Total Protein 6.5 - 8.1 g/dL - - -  Total Bilirubin 0.3 - 1.2 mg/dL - - -  Alkaline Phos 38 - 126 U/L - - -  AST 15 - 41 U/L - - -  ALT 0 - 44 U/L - - -    Lab Results  Component Value Date   WBC 6.1 09/27/2020   HGB 8.3 (L) 09/27/2020   HCT 25.8 (L) 09/27/2020   MCV 104.9 (H) 09/27/2020   PLT 247 09/27/2020   NEUTROABS 3.6 09/27/2020     RADIOGRAPHIC STUDIES: I  have personally reviewed the radiological images as listed and agreed with the findings in the report. No results found.  I discussed the assessment and treatment plan with the patient. The patient was provided an opportunity to ask questions and all were answered. The patient agreed with the plan and demonstrated an understanding of the instructions. The patient was advised to call back or seek an in-person evaluation if the symptoms worsen or if the condition fails to improve as anticipated.    I spent {CHL ONC TIME VISIT - [id number]} for the appointment reviewing test results, discuss management and coordination of care.  Derwood Kaplan, MD 12/14/2020 6:49 AM

## 2020-12-18 LAB — PROTEIN ELECTROPHORESIS, SERUM
A/G Ratio: 1.4 (ref 0.7–1.7)
Albumin ELP: 3.8 g/dL (ref 2.9–4.4)
Alpha-1-Globulin: 0.3 g/dL (ref 0.0–0.4)
Alpha-2-Globulin: 0.6 g/dL (ref 0.4–1.0)
Beta Globulin: 0.9 g/dL (ref 0.7–1.3)
Gamma Globulin: 1 g/dL (ref 0.4–1.8)
Globulin, Total: 2.8 g/dL (ref 2.2–3.9)
Total Protein ELP: 6.6 g/dL (ref 6.0–8.5)

## 2020-12-23 DIAGNOSIS — E039 Hypothyroidism, unspecified: Secondary | ICD-10-CM | POA: Insufficient documentation

## 2020-12-25 ENCOUNTER — Telehealth: Payer: Self-pay

## 2020-12-25 NOTE — Telephone Encounter (Signed)
-----   Message from Derwood Kaplan, MD sent at 12/23/2020  8:24 PM EDT ----- Regarding: labs Send copy of labs to Dr. Truman Hayward.  Her iron levels are high, she can stop the [facility]

## 2020-12-25 NOTE — Telephone Encounter (Signed)
Faxed all recent labs to Dr. Marguerita Beards office

## 2021-01-08 LAB — CBC AND DIFFERENTIAL
HCT: 21 — AB (ref 36–46)
Hemoglobin: 7.1 — AB (ref 12.0–16.0)
Neutrophils Absolute: 1.8
Platelets: 198 (ref 150–399)
WBC: 3.7

## 2021-01-08 LAB — CBC: RBC: 1.96 — AB (ref 3.87–5.11)

## 2021-01-10 ENCOUNTER — Encounter: Payer: Self-pay | Admitting: Hematology and Oncology

## 2021-01-10 ENCOUNTER — Telehealth: Payer: Self-pay | Admitting: Hematology and Oncology

## 2021-01-10 ENCOUNTER — Other Ambulatory Visit: Payer: Self-pay | Admitting: Hematology and Oncology

## 2021-01-10 DIAGNOSIS — D649 Anemia, unspecified: Secondary | ICD-10-CM

## 2021-01-10 NOTE — Telephone Encounter (Signed)
Received CBC from Dr. Marguerita Beards office HgB 7.1. They want [place] to arrange for transfusion. Could not be done at our infusion center or [facility] this week. Arranged for patient to come on Friday 11/18 at 2:15 pm for CBC, type and screen for transfusion on Monday 11/21 at 1 pm. Her husband is currently at [facility] after having a stroke. Patient verbalized understanding.

## 2021-01-12 ENCOUNTER — Other Ambulatory Visit: Payer: Medicare Other

## 2021-01-12 ENCOUNTER — Other Ambulatory Visit: Payer: Self-pay | Admitting: Hematology and Oncology

## 2021-01-12 DIAGNOSIS — D649 Anemia, unspecified: Secondary | ICD-10-CM

## 2021-01-12 LAB — CBC AND DIFFERENTIAL
HCT: 24 — AB (ref 36–46)
Hemoglobin: 7.9 — AB (ref 12.0–16.0)
Neutrophils Absolute: 2.21
Platelets: 188 (ref 150–399)
WBC: 3.5

## 2021-01-12 LAB — CBC: RBC: 2.13 — AB (ref 3.87–5.11)

## 2021-01-15 ENCOUNTER — Inpatient Hospital Stay: Payer: Medicare Other

## 2021-01-15 ENCOUNTER — Other Ambulatory Visit: Payer: Self-pay | Admitting: Hematology and Oncology

## 2021-01-15 LAB — CBC: MCV: 111 — AB (ref 81–99)

## 2021-01-23 ENCOUNTER — Other Ambulatory Visit: Payer: Self-pay

## 2021-01-25 ENCOUNTER — Other Ambulatory Visit: Payer: Medicare Other

## 2021-01-25 ENCOUNTER — Ambulatory Visit: Payer: Medicare Other | Admitting: Hematology and Oncology

## 2021-01-26 ENCOUNTER — Inpatient Hospital Stay: Payer: Medicare Other

## 2021-01-29 NOTE — Progress Notes (Signed)
Patient Care Team: Cher Nakai, MD as PCP - General (Internal Medicine)  Clinic Day:  01/30/2021  Referring physician: Lou Miner, MD  ASSESSMENT & PLAN:   Assessment & Plan: Anemia An 80 year old female with anemia.  No etiology has been found, and so this may represent anemia of chronic disease, chronic GI blood loss or be myelophthisic in nature. Hemoglobin today is 8.2. She is fairly symptomatic today mainly with severe fatigue. We will plan to transfuse one unit tomorrow. She follows closely with Dr. Truman Hayward and we will plan to see her back in 3 months.     The patient understands the plans discussed today and is in agreement with them.  She knows to contact our office if she develops concerns prior to her next appointment.    Melodye Ped, NP  [facility] [address] [place] [address] Dept: [phone] Dept Fax: [phone]   No orders of the defined types were placed in this encounter.     CHIEF COMPLAINT:  CC: An 80 year old female with history of anemia here for 2 week evaluation.  Current Treatment:  Oral iron  INTERVAL HISTORY:  Zoua is here today for repeat clinical assessment. She denies fevers or chills. She denies pain. Her appetite is good. Her weight has been stable.  I have reviewed the past medical history, past surgical history, social history and family history with the patient and they are unchanged from previous note.  ALLERGIES:  is allergic to moxifloxacin.  MEDICATIONS:  Current Outpatient Medications  Medication Sig Dispense Refill   amLODipine (NORVASC) 5 MG tablet Take 1 tablet (5 mg total) by mouth daily. 30 tablet 0   busPIRone (BUSPAR) 10 MG tablet Take 10 mg by mouth 2 (two) times daily.     doxycycline (VIBRA-TABS) 100 MG tablet Take 100 mg by mouth 2 (two) times daily.     FEROSUL 325 (65 Fe) MG tablet Take 325 mg by mouth 2 (two) times daily.     folic  acid (FOLVITE) 1 MG tablet Take 1 mg by mouth daily.     lansoprazole (PREVACID) 30 MG capsule Take 30 mg by mouth 2 (two) times daily.     losartan (COZAAR) 25 MG tablet Take 25 mg by mouth daily.     naloxone (NARCAN) nasal spray 4 mg/0.1 mL See admin instructions.     oxyCODONE-acetaminophen (PERCOCET) 7.5-325 MG tablet Take 1 tablet by mouth every 6 (six) hours.     pregabalin (LYRICA) 75 MG capsule Take 75 mg by mouth 3 (three) times daily.     promethazine (PHENERGAN) 25 MG tablet Take 25 mg by mouth 4 (four) times daily as needed.     SYNTHROID 100 MCG tablet Take 100 mcg by mouth daily.     traZODone (DESYREL) 50 MG tablet Take 50 mg by mouth at bedtime as needed for sleep.     No current facility-administered medications for this visit.    HISTORY OF PRESENT ILLNESS:   Oncology History   No history exists.      REVIEW OF SYSTEMS:   Constitutional: Denies fevers, chills or abnormal weight loss Eyes: Denies blurriness of vision Ears, nose, mouth, throat, and face: Denies mucositis or sore throat Respiratory: Denies cough, dyspnea or wheezes Cardiovascular: Denies palpitation, chest discomfort or lower extremity swelling Gastrointestinal:  Denies nausea, heartburn or change in bowel habits Skin: Denies abnormal skin rashes Lymphatics: Denies new lymphadenopathy or easy  bruising Neurological:Denies numbness, tingling or new weaknesses Behavioral/Psych: Mood is stable, no new changes  All other systems were reviewed with the patient and are negative.   VITALS:  Blood pressure 132/80, pulse 79, temperature 98.5 F (36.9 C), temperature source Oral, resp. rate 20, height 5\' 2"  (1.575 m), weight 138 lb 1.6 oz (62.6 kg), SpO2 99 %.  Wt Readings from Last 3 Encounters:  01/30/21 138 lb 1.6 oz (62.6 kg)  12/14/20 141 lb (64 kg)  09/27/20 156 lb 4.9 oz (70.9 kg)    Body mass index is 25.26 kg/m.  Performance status (ECOG): 1 - Symptomatic but completely  ambulatory  PHYSICAL EXAM:   GENERAL:alert, no distress and comfortable SKIN: skin color, texture, turgor are normal, no rashes or significant lesions EYES: normal, Conjunctiva are pink and non-injected, sclera clear OROPHARYNX:no exudate, no erythema and lips, buccal mucosa, and tongue normal  NECK: supple, thyroid normal size, non-tender, without nodularity LYMPH:  no palpable lymphadenopathy in the cervical, axillary or inguinal LUNGS: clear to auscultation and percussion with normal breathing effort HEART: regular rate & rhythm and no murmurs and no lower extremity edema ABDOMEN:abdomen soft, non-tender and normal bowel sounds Musculoskeletal:no cyanosis of digits and no clubbing  NEURO: alert & oriented x 3 with fluent speech, no focal motor/sensory deficits  LABORATORY DATA:  I have reviewed the data as listed    Component Value Date/Time   NA 139 12/14/2020 0000   K 4.7 12/14/2020 0000   CL 110 (A) 12/14/2020 0000   CO2 19 12/14/2020 0000   GLUCOSE 103 (H) 09/27/2020 0241   BUN 42 (A) 12/14/2020 0000   CREATININE 1.2 (A) 12/14/2020 0000   CREATININE 0.78 09/27/2020 0241   CALCIUM 8.2 (A) 12/14/2020 0000   PROT 4.9 (L) 09/22/2020 0833   ALBUMIN 4.1 12/14/2020 0000   AST 24 12/14/2020 0000   ALT 12 12/14/2020 0000   ALKPHOS 143 (A) 12/14/2020 0000   BILITOT 1.1 09/22/2020 0833   GFRNONAA >60 09/27/2020 0241   GFRAA 92 08/12/2006 1527    No results found for: SPEP, UPEP  Lab Results  Component Value Date   WBC 2.6 01/30/2021   NEUTROABS 1.07 01/30/2021   HGB 8.4 (A) 01/30/2021   HCT 25 (A) 01/30/2021   MCV 111 (A) 01/15/2021   PLT 167 01/30/2021      Chemistry      Component Value Date/Time   NA 139 12/14/2020 0000   K 4.7 12/14/2020 0000   CL 110 (A) 12/14/2020 0000   CO2 19 12/14/2020 0000   BUN 42 (A) 12/14/2020 0000   CREATININE 1.2 (A) 12/14/2020 0000   CREATININE 0.78 09/27/2020 0241   GLU 95 12/14/2020 0000      Component Value Date/Time    CALCIUM 8.2 (A) 12/14/2020 0000   ALKPHOS 143 (A) 12/14/2020 0000   AST 24 12/14/2020 0000   ALT 12 12/14/2020 0000   BILITOT 1.1 09/22/2020 0833       RADIOGRAPHIC STUDIES: I have personally reviewed the radiological images as listed and agreed with the findings in the report. No results found.

## 2021-01-30 ENCOUNTER — Encounter: Payer: Self-pay | Admitting: Hematology and Oncology

## 2021-01-30 ENCOUNTER — Inpatient Hospital Stay (INDEPENDENT_AMBULATORY_CARE_PROVIDER_SITE_OTHER): Payer: Medicare Other | Admitting: Hematology and Oncology

## 2021-01-30 ENCOUNTER — Other Ambulatory Visit: Payer: Self-pay

## 2021-01-30 ENCOUNTER — Inpatient Hospital Stay: Payer: Medicare Other | Attending: Oncology

## 2021-01-30 DIAGNOSIS — D649 Anemia, unspecified: Secondary | ICD-10-CM

## 2021-01-30 LAB — CBC AND DIFFERENTIAL
HCT: 25 — AB (ref 36–46)
Hemoglobin: 8.4 — AB (ref 12.0–16.0)
Neutrophils Absolute: 1.07
Platelets: 167 (ref 150–399)
WBC: 2.6

## 2021-01-30 LAB — CBC: RBC: 2.24 — AB (ref 3.87–5.11)

## 2021-01-30 NOTE — Assessment & Plan Note (Signed)
An 80 year old female with anemia.  No etiology has been found, and so this may represent anemia of chronic disease, chronic GI blood loss or be myelophthisic in nature. Hemoglobin today is 8.2. She is fairly symptomatic today mainly with severe fatigue. We will plan to transfuse one unit tomorrow. She follows closely with Dr. Truman Hayward and we will plan to see her back in 3 months.

## 2021-04-23 ENCOUNTER — Encounter: Payer: Self-pay | Admitting: Hematology and Oncology

## 2021-04-23 LAB — CBC AND DIFFERENTIAL
HCT: 27 — AB (ref 36–46)
Hemoglobin: 8.9 — AB (ref 12.0–16.0)
Neutrophils Absolute: 1.3
Platelets: 166 10*3/uL (ref 150–400)
WBC: 2.9

## 2021-04-23 LAB — CBC: RBC: 2.69 — AB (ref 3.87–5.11)

## 2021-04-25 NOTE — Progress Notes (Incomplete)
[facility]  [address] [place],  [place]  [address] [phone]  Clinic Day:  04/25/2021  Referring physician: Lou Miner, MD  This document serves as a record of services personally performed by Hosie Poisson, MD. It was created on their behalf by Curry,Lauren E, a trained medical scribe. The creation of this record is based on the scribe's personal observations and the provider's statements to them.  CHIEF COMPLAINT:  CC: Anemia  Current Treatment:  Intermittent transfusions   HISTORY OF PRESENT ILLNESS:  Cynthia Benson is a 81 y.o. female referred by Dr. Cher Nakai for evaluation of her anemia.  She describes being anemic for years.  She was found to be anemic at Dr. Marguerita Beards office in October of 2019 where her hemoglobin was 10.1 with an MCV of 106.  White count and platelets were within the normal range at that time.  Most recent lab work up from Dr. Zenia Resides office in October 2020 revealed a low hemoglobin of 8.6 with an MCV of 97.  White count and platelets were normal.  Iron studies revealed an iron level of 45 with a low TIBC of 191 for a percent saturation of 24%.  Serum ferritin was elevated at 327 and B12 level was elevated at 1570.  Folate level was within the normal range at 6.9.  She states that she has known she was anemic for several years.  She has had transfusions in the past. She started menarche around age 66, and has had 5 pregnancies with 5 live births.  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   INTERVAL HISTORY:  Cynthia Benson is here for routine follow up ***.   Her  appetite is good, and she has gained/lost _ pounds since her last visit.  She denies fever, chills or other signs of infection.  She denies nausea, vomiting, bowel issues, or abdominal pain.  She denies sore throat, cough, dyspnea, or chest pain.  REVIEW OF SYSTEMS:  Review of Systems   Constitutional:  Positive for fatigue (chronic). Negative for appetite change, chills, fever and unexpected weight change.  HENT:  Negative.    Eyes: Negative.   Respiratory: Negative.  Negative for chest tightness, cough, hemoptysis, shortness of breath and wheezing.   Cardiovascular: Negative.  Negative for chest pain, leg swelling and palpitations.  Gastrointestinal: Negative.  Negative for abdominal distention, abdominal pain, blood in stool, constipation, diarrhea, nausea and vomiting.  Endocrine: Negative.   Genitourinary: Negative.  Negative for difficulty urinating, dysuria, frequency and hematuria.   Musculoskeletal:  Positive for back pain (chronic). Negative for arthralgias, flank pain, gait problem and myalgias.       Chronic leg pain  Skin: Negative.   Neurological: Negative.  Negative for dizziness, extremity weakness, gait problem, headaches, light-headedness, numbness, seizures and speech difficulty.  Hematological: Negative.   Psychiatric/Behavioral: Negative.  Negative for depression and sleep disturbance. The patient is not nervous/anxious.     VITALS:  There were no vitals taken for this visit.  Wt Readings from Last 3 Encounters:  01/30/21 138 lb 1.6 oz (62.6 kg)  12/14/20 141 lb (64 kg)  09/27/20 156 lb 4.9 oz (70.9 kg)    There is no height or weight on file to calculate BMI.  Performance status (ECOG): 1 - Symptomatic but completely ambulatory  PHYSICAL EXAM:  Physical Exam Constitutional:      General: She is not in acute distress.  Appearance: Normal appearance. She is normal weight.  HENT:     Head: Normocephalic and atraumatic.  Eyes:     General: No scleral icterus.    Extraocular Movements: Extraocular movements intact.     Conjunctiva/sclera: Conjunctivae normal.     Pupils: Pupils are equal, round, and reactive to light.  Cardiovascular:     Rate and Rhythm: Normal rate and regular rhythm.     Pulses: Normal pulses.     Heart sounds: Normal  heart sounds. No murmur heard.   No friction rub. No gallop.  Pulmonary:     Effort: Pulmonary effort is normal. No respiratory distress.     Breath sounds: Normal breath sounds.  Abdominal:     General: Bowel sounds are normal. There is no distension.     Palpations: Abdomen is soft. There is no hepatomegaly, splenomegaly or mass.     Tenderness: There is no abdominal tenderness.  Musculoskeletal:        General: Normal range of motion.     Cervical back: Normal range of motion and neck supple.     Right lower leg: No edema.     Left lower leg: No edema.  Lymphadenopathy:     Cervical: No cervical adenopathy.  Skin:    General: Skin is warm and dry.     Coloration: Skin is pale.  Neurological:     General: No focal deficit present.     Mental Status: She is alert and oriented to person, place, and time. Mental status is at baseline.  Psychiatric:        Mood and Affect: Mood normal.        Behavior: Behavior normal.        Thought Content: Thought content normal.        Judgment: Judgment normal.    LABS:   CBC Latest Ref Rng & Units 01/30/2021 01/12/2021 01/08/2021  WBC - 2.6 3.5 3.7  Hemoglobin 12.0 - 16.0 8.4(A) 7.9(A) 7.1(A)  Hematocrit 36 - 46 25(A) 24(A) 21(A)  Platelets 150 - 399 167 188 198   CMP Latest Ref Rng & Units 12/14/2020 09/27/2020 09/26/2020  Glucose 70 - 99 mg/dL - 103(H) 92  BUN 4 - 21 42(A) 11 15  Creatinine 0.5 - 1.1 1.2(A) 0.78 0.89  Sodium 137 - 147 139 136 136  Potassium 3.4 - 5.3 4.7 3.2(L) 3.1(L)  Chloride 99 - 108 110(A) 100 103  CO2 13 - 22 19 25 22   Calcium 8.7 - 10.7 8.2(A) 8.6(L) 8.3(L)  Total Protein 6.5 - 8.1 g/dL - - -  Total Bilirubin 0.3 - 1.2 mg/dL - - -  Alkaline Phos 25 - 125 143(A) - -  AST 13 - 35 24 - -  ALT 7 - 35 12 - -   Lab Results  Component Value Date   TOTALPROTELP 6.6 12/14/2020   ALBUMINELP 3.8 12/14/2020   A1GS 0.3 12/14/2020   A2GS 0.6 12/14/2020   BETS 0.9 12/14/2020   GAMS 1.0 12/14/2020   MSPIKE Not  Observed 12/14/2020   SPEI Comment 12/14/2020   Lab Results  Component Value Date   TIBC 277 12/14/2020   TIBC 214 (L) 09/26/2020   FERRITIN 313 (H) 12/14/2020   FERRITIN 615 (H) 09/26/2020   IRONPCTSAT 66 (H) 12/14/2020   IRONPCTSAT 36 (H) 09/26/2020   No results found for: LDH  STUDIES:  No results found.    HISTORY:   Allergies:  Allergies  Allergen Reactions   Moxifloxacin Rash  Current Medications: Current Outpatient Medications  Medication Sig Dispense Refill   amLODipine (NORVASC) 5 MG tablet Take 1 tablet (5 mg total) by mouth daily. 30 tablet 0   busPIRone (BUSPAR) 10 MG tablet Take 10 mg by mouth 2 (two) times daily.     doxycycline (VIBRA-TABS) 100 MG tablet Take 100 mg by mouth 2 (two) times daily.     FEROSUL 325 (65 Fe) MG tablet Take 325 mg by mouth 2 (two) times daily.     folic acid (FOLVITE) 1 MG tablet Take 1 mg by mouth daily.     lansoprazole (PREVACID) 30 MG capsule Take 30 mg by mouth 2 (two) times daily.     losartan (COZAAR) 25 MG tablet Take 25 mg by mouth daily.     naloxone (NARCAN) nasal spray 4 mg/0.1 mL See admin instructions.     oxyCODONE-acetaminophen (PERCOCET) 7.5-325 MG tablet Take 1 tablet by mouth every 6 (six) hours.     pregabalin (LYRICA) 75 MG capsule Take 75 mg by mouth 3 (three) times daily.     promethazine (PHENERGAN) 25 MG tablet Take 25 mg by mouth 4 (four) times daily as needed.     SYNTHROID 100 MCG tablet Take 100 mcg by mouth daily.     traZODone (DESYREL) 50 MG tablet Take 50 mg by mouth at bedtime as needed for sleep.     No current facility-administered medications for this visit.     ASSESSMENT & PLAN:   Assessment/Plan:   Anemia.  No etiology has been found, and so this may represent anemia of chronic disease, chronic GI blood loss or be myelophthisic in nature. Likely, she will require intermittent transfusions, but this does not appear to be very frequent. As Retocrit injections are expensive and would  require multiple trips to the infusion center, I do not recommend these since she has already missed several scheduled appointments. She last received a transfusion in December 2022.  I will notify her of the other pending lab results, and forward them to Dr. Truman Hayward. He will be following her closely and can call if she has a significant decrease in her hemoglobin. She knows to continue oral iron and B12 supplements daily. Dr. Truman Hayward see's her fairly regularly, and so I will see her back in 6 months with CBC, CMP and type and cross match for repeat clinical assessment. She understands and agrees with this plan of care.  I have answered their questions and they know to call with any concerns.   I provided 30 minutes of face-to-face time during this this encounter and > 50% was spent counseling as documented under my assessment and plan.    Derwood Kaplan, MD [facility] [address] [place] [address] Dept: [phone] Dept Fax: [phone]   I, Rita Ohara, am acting as scribe for Derwood Kaplan, MD  I have reviewed this report as typed by the medical scribe, and it is complete and accurate.  Hermina Barters

## 2021-04-30 ENCOUNTER — Ambulatory Visit: Payer: Medicare Other | Admitting: Oncology

## 2021-04-30 ENCOUNTER — Other Ambulatory Visit: Payer: Medicare Other

## 2021-05-02 ENCOUNTER — Encounter: Payer: Self-pay | Admitting: Oncology

## 2021-05-03 NOTE — Progress Notes (Incomplete)
[facility]  [address] [place],  [place]  [address] [phone]  Clinic Day:  05/03/2021  Referring physician: Cher Nakai, MD  This document serves as a record of services personally performed by Hosie Poisson, MD. It was created on their behalf by [facility] E, a trained medical scribe. The creation of this record is based on the scribe's personal observations and the provider's statements to them.  CHIEF COMPLAINT:  CC: Anemia  Current Treatment:  Intermittent transfusions   HISTORY OF PRESENT ILLNESS:  Renell Coaxum Wylodean Shimmel is a 81 y.o. female referred by Dr. Cher Nakai for evaluation of her anemia.  She describes being anemic for years.  She was found to be anemic at Dr. Marguerita Beards office in October of 2019 where her hemoglobin was 10.1 with an MCV of 106.  White count and platelets were within the normal range at that time.  Most recent lab work up from Dr. Zenia Resides office in October 2020 revealed a low hemoglobin of 8.6 with an MCV of 97.  White count and platelets were normal.  Iron studies revealed an iron level of 45 with a low TIBC of 191 for a percent saturation of 24%.  Serum ferritin was elevated at 327 and B12 level was elevated at 1570.  Folate level was within the normal range at 6.9.  She states that she has known she was anemic for several years.  She has had transfusions in the past. She started menarche around age 21, and has had 5 pregnancies with 5 live births.  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   INTERVAL HISTORY:  Aubreigh is here for follow up after missing several scheduled appointements. She had 4 units of PRBCs in April for a hemoglobin of 7.8, and by May her hemoglobin was 10.9. She also had transfusions last year. Etiology of her anemia has never been found. She reports chronic fatigue. She denies pica to ice, mouth soreness or melena. She states that she has  had 3 bleeding ulcers in the past many years ago. She continues oral iron and B12 supplement, but we will check levels today. She has chronic pain of the back and legs and had previously been on morphine, but is now on Lyrica. Hemoglobin has dropped from 10.9 in May to 7.5 with an MCV of 108, and white count and platelets are normal. Chemistries are unremarkable except for  BUN of 42, a creatinine of 1.2, stable, and a calcium of 8.2. Her  appetite is good, and she is eating well.  She denies fever, chills or other signs of infection.  She denies nausea, vomiting, bowel issues, or abdominal pain.  She denies sore throat, cough, dyspnea, or chest pain.  Illianna is here for routine follow up ***. She did receive 1 unit of packed red cells in December for a hemoglobin of 8.2.    Her  appetite is good, and she has gained/lost _ pounds since her last visit.  She denies fever, chills or other signs of infection.  She denies nausea, vomiting, bowel issues, or abdominal pain.  She denies sore throat, cough, dyspnea, or chest pain.  REVIEW OF SYSTEMS:  Review of Systems  Constitutional:  Positive for fatigue (chronic). Negative for appetite change, chills, fever and unexpected weight change.  HENT:  Negative.    Eyes: Negative.   Respiratory: Negative.  Negative for chest tightness, cough, hemoptysis, shortness of  breath and wheezing.   Cardiovascular: Negative.  Negative for chest pain, leg swelling and palpitations.  Gastrointestinal: Negative.  Negative for abdominal distention, abdominal pain, blood in stool, constipation, diarrhea, nausea and vomiting.  Endocrine: Negative.   Genitourinary: Negative.  Negative for difficulty urinating, dysuria, frequency and hematuria.   Musculoskeletal:  Positive for back pain (chronic). Negative for arthralgias, flank pain, gait problem and myalgias.       Chronic leg pain  Skin: Negative.   Neurological: Negative.  Negative for dizziness, extremity weakness, gait  problem, headaches, light-headedness, numbness, seizures and speech difficulty.  Hematological: Negative.   Psychiatric/Behavioral: Negative.  Negative for depression and sleep disturbance. The patient is not nervous/anxious.     VITALS:  There were no vitals taken for this visit.  Wt Readings from Last 3 Encounters:  01/30/21 138 lb 1.6 oz (62.6 kg)  12/14/20 141 lb (64 kg)  09/27/20 156 lb 4.9 oz (70.9 kg)    There is no height or weight on file to calculate BMI.  Performance status (ECOG): 1 - Symptomatic but completely ambulatory  PHYSICAL EXAM:  Physical Exam Constitutional:      General: She is not in acute distress.    Appearance: Normal appearance. She is normal weight.  HENT:     Head: Normocephalic and atraumatic.  Eyes:     General: No scleral icterus.    Extraocular Movements: Extraocular movements intact.     Conjunctiva/sclera: Conjunctivae normal.     Pupils: Pupils are equal, round, and reactive to light.  Cardiovascular:     Rate and Rhythm: Normal rate and regular rhythm.     Pulses: Normal pulses.     Heart sounds: Normal heart sounds. No murmur heard.   No friction rub. No gallop.  Pulmonary:     Effort: Pulmonary effort is normal. No respiratory distress.     Breath sounds: Normal breath sounds.  Abdominal:     General: Bowel sounds are normal. There is no distension.     Palpations: Abdomen is soft. There is no hepatomegaly, splenomegaly or mass.     Tenderness: There is no abdominal tenderness.  Musculoskeletal:        General: Normal range of motion.     Cervical back: Normal range of motion and neck supple.     Right lower leg: No edema.     Left lower leg: No edema.  Lymphadenopathy:     Cervical: No cervical adenopathy.  Skin:    General: Skin is warm and dry.     Coloration: Skin is pale.  Neurological:     General: No focal deficit present.     Mental Status: She is alert and oriented to person, place, and time. Mental status is at  baseline.  Psychiatric:        Mood and Affect: Mood normal.        Behavior: Behavior normal.        Thought Content: Thought content normal.        Judgment: Judgment normal.    LABS:   CBC Latest Ref Rng & Units 04/23/2021 01/30/2021 01/12/2021  WBC - 2.9 2.6 3.5  Hemoglobin 12.0 - 16.0 8.9(A) 8.4(A) 7.9(A)  Hematocrit 36 - 46 27(A) 25(A) 24(A)  Platelets 150 - 400 K/uL 166 167 188   CMP Latest Ref Rng & Units 12/14/2020 09/27/2020 09/26/2020  Glucose 70 - 99 mg/dL - 103(H) 92  BUN 4 - 21 42(A) 11 15  Creatinine 0.5 - 1.1 1.2(A) 0.78  0.89  Sodium 137 - 147 139 136 136  Potassium 3.4 - 5.3 4.7 3.2(L) 3.1(L)  Chloride 99 - 108 110(A) 100 103  CO2 13 - '22 19 25 22  '$ Calcium 8.7 - 10.7 8.2(A) 8.6(L) 8.3(L)  Total Protein 6.5 - 8.1 g/dL - - -  Total Bilirubin 0.3 - 1.2 mg/dL - - -  Alkaline Phos 25 - 125 143(A) - -  AST 13 - 35 24 - -  ALT 7 - 35 12 - -   Lab Results  Component Value Date   TOTALPROTELP 6.6 12/14/2020   ALBUMINELP 3.8 12/14/2020   A1GS 0.3 12/14/2020   A2GS 0.6 12/14/2020   BETS 0.9 12/14/2020   GAMS 1.0 12/14/2020   MSPIKE Not Observed 12/14/2020   SPEI Comment 12/14/2020   Lab Results  Component Value Date   TIBC 277 12/14/2020   TIBC 214 (L) 09/26/2020   FERRITIN 313 (H) 12/14/2020   FERRITIN 615 (H) 09/26/2020   IRONPCTSAT 66 (H) 12/14/2020   IRONPCTSAT 36 (H) 09/26/2020   No results found for: LDH  STUDIES:  No results found.    HISTORY:   Allergies:  Allergies  Allergen Reactions   Moxifloxacin Rash    Current Medications: Current Outpatient Medications  Medication Sig Dispense Refill   amLODipine (NORVASC) 5 MG tablet Take 1 tablet (5 mg total) by mouth daily. 30 tablet 0   busPIRone (BUSPAR) 10 MG tablet Take 10 mg by mouth 2 (two) times daily.     doxycycline (VIBRA-TABS) 100 MG tablet Take 100 mg by mouth 2 (two) times daily.     FEROSUL 325 (65 Fe) MG tablet Take 325 mg by mouth 2 (two) times daily.     folic acid (FOLVITE) 1  MG tablet Take 1 mg by mouth daily.     lansoprazole (PREVACID) 30 MG capsule Take 30 mg by mouth 2 (two) times daily.     losartan (COZAAR) 25 MG tablet Take 25 mg by mouth daily.     naloxone (NARCAN) nasal spray 4 mg/0.1 mL See admin instructions.     oxyCODONE-acetaminophen (PERCOCET) 7.5-325 MG tablet Take 1 tablet by mouth every 6 (six) hours.     pregabalin (LYRICA) 75 MG capsule Take 75 mg by mouth 3 (three) times daily.     promethazine (PHENERGAN) 25 MG tablet Take 25 mg by mouth 4 (four) times daily as needed.     SYNTHROID 100 MCG tablet Take 100 mcg by mouth daily.     traZODone (DESYREL) 50 MG tablet Take 50 mg by mouth at bedtime as needed for sleep.     No current facility-administered medications for this visit.     ASSESSMENT & PLAN:   Assessment/Plan:  Clarke Amburn is a 81 y.o. female with anemia.  No etiology has been found, and so this may represent anemia of chronic disease, chronic GI blood loss or be myelophthisic in nature.  Likely, she will require intermittent transfusions, and last received 1 unit in December 2022.   I will notify her of the other pending lab results, and forward them to Dr. Truman Hayward. He will be following her closely and can call if she has a significant decrease in her hemoglobin. She knows to continue oral iron and B12 supplements daily. Dr. Truman Hayward see's her fairly regularly, and so I will see her back in 6 months with CBC, CMP and type and cross match for repeat clinical assessment. She understands and agrees with this plan of  care.  I have answered their questions and they know to call with any concerns.   I provided *** minutes of face-to-face time during this this encounter and > 50% was spent counseling as documented under my assessment and plan.    Derwood Kaplan, MD [facility] AT [facility] [address] [place] [address] Dept: [phone] Dept Fax: [phone]   I,  Rita Ohara, am acting as scribe for Derwood Kaplan, MD  I have reviewed this report as typed by the medical scribe, and it is complete and accurate.  Hermina Barters

## 2021-05-10 ENCOUNTER — Other Ambulatory Visit: Payer: Self-pay

## 2021-05-10 ENCOUNTER — Inpatient Hospital Stay: Payer: Medicare Other | Attending: Oncology | Admitting: Oncology

## 2021-05-10 ENCOUNTER — Encounter: Payer: Self-pay | Admitting: Oncology

## 2021-05-10 ENCOUNTER — Inpatient Hospital Stay: Payer: Medicare Other

## 2021-05-10 VITALS — BP 137/64 | HR 69 | Temp 98.8°F | Resp 19 | Ht 62.0 in | Wt 131.5 lb

## 2021-05-10 DIAGNOSIS — D4620 Refractory anemia with excess of blasts, unspecified: Secondary | ICD-10-CM | POA: Diagnosis not present

## 2021-05-10 DIAGNOSIS — D61818 Other pancytopenia: Secondary | ICD-10-CM | POA: Diagnosis not present

## 2021-05-10 DIAGNOSIS — D46Z Other myelodysplastic syndromes: Secondary | ICD-10-CM

## 2021-05-10 DIAGNOSIS — D72819 Decreased white blood cell count, unspecified: Secondary | ICD-10-CM

## 2021-05-10 DIAGNOSIS — D649 Anemia, unspecified: Secondary | ICD-10-CM | POA: Diagnosis not present

## 2021-05-10 DIAGNOSIS — D46B Refractory cytopenia with multilineage dysplasia and ring sideroblasts: Secondary | ICD-10-CM | POA: Insufficient documentation

## 2021-05-10 LAB — CBC: RBC: 2.51 — AB (ref 3.87–5.11)

## 2021-05-10 LAB — CBC AND DIFFERENTIAL
HCT: 27 — AB (ref 36–46)
Hemoglobin: 8.8 — AB (ref 12.0–16.0)
Neutrophils Absolute: 1.23
Platelets: 116 10*3/uL — AB (ref 150–400)
WBC: 2.8

## 2021-06-14 ENCOUNTER — Ambulatory Visit: Payer: Medicare Other | Admitting: Oncology

## 2021-06-14 ENCOUNTER — Other Ambulatory Visit: Payer: Medicare Other

## 2021-08-13 ENCOUNTER — Ambulatory Visit: Payer: Medicare Other | Admitting: Family Medicine

## 2021-08-13 DIAGNOSIS — D4620 Refractory anemia with excess of blasts, unspecified: Secondary | ICD-10-CM | POA: Diagnosis not present

## 2021-08-13 DIAGNOSIS — D61818 Other pancytopenia: Secondary | ICD-10-CM | POA: Diagnosis not present

## 2021-08-13 DIAGNOSIS — D72819 Decreased white blood cell count, unspecified: Secondary | ICD-10-CM | POA: Diagnosis not present

## 2021-08-13 DIAGNOSIS — D649 Anemia, unspecified: Secondary | ICD-10-CM | POA: Diagnosis not present

## 2021-08-15 ENCOUNTER — Other Ambulatory Visit (HOSPITAL_COMMUNITY)
Admission: RE | Admit: 2021-08-15 | Discharge: 2021-08-15 | Disposition: A | Discharge status: Home / Self Care | Payer: Medicare Other | Source: Other Acute Inpatient Hospital | Attending: Oncology | Admitting: Oncology

## 2021-08-15 DIAGNOSIS — D649 Anemia, unspecified: Secondary | ICD-10-CM | POA: Insufficient documentation

## 2021-08-16 LAB — SURGICAL PATHOLOGY

## 2021-08-22 ENCOUNTER — Encounter (HOSPITAL_COMMUNITY): Payer: Self-pay | Admitting: Oncology

## 2021-08-23 ENCOUNTER — Encounter (HOSPITAL_COMMUNITY): Payer: Self-pay | Admitting: Oncology

## 2021-08-27 LAB — SURGICAL PATHOLOGY

## 2021-09-03 ENCOUNTER — Ambulatory Visit: Payer: Medicare Other | Admitting: Hematology and Oncology

## 2021-09-03 ENCOUNTER — Other Ambulatory Visit: Payer: Medicare Other

## 2021-09-03 ENCOUNTER — Other Ambulatory Visit: Payer: Self-pay | Admitting: Oncology

## 2021-09-03 DIAGNOSIS — D46Z Other myelodysplastic syndromes: Secondary | ICD-10-CM

## 2021-09-03 NOTE — Progress Notes (Signed)
[facility]  [address] [place],  [place]  [address] [phone]  Clinic Day:  09/04/2021  Referring physician: Waynard Edwards, MD   HISTORY OF PRESENT ILLNESS:  The patient is a 81 y.o. female with a longstanding history of anemia.  In the recent past, a GI workup did not reveal a GI source of blood loss.  Furthermore, she denied having any overt forms of blood loss.  While in the hospital recently, labs showed progressive leukopenia as well.  Based upon these cytopenias, a bone marrow biopsy was done for further evaluation.  She comes in today to go over her bone marrow results and their implications.  Since being discharged from the hospital, the patient has been doing okay.  She complains of baseline fatigue, but denies having any other changes in her daily health.  PHYSICAL EXAM:  Blood pressure (!) 179/77, pulse 78, temperature 98.4 F (36.9 C), resp. rate 16, height '5\' 2"'  (1.575 m), weight 131 lb 14.4 oz (59.8 kg), SpO2 97 %. Wt Readings from Last 3 Encounters:  09/04/21 131 lb 14.4 oz (59.8 kg)  05/10/21 131 lb 8 oz (59.6 kg)  01/30/21 138 lb 1.6 oz (62.6 kg)   Body mass index is 24.12 kg/m. Performance status (ECOG): 2 - Symptomatic, <50% confined to bed Physical Exam Constitutional:      Appearance: Normal appearance. She is not ill-appearing.  HENT:     Mouth/Throat:     Mouth: Mucous membranes are moist.     Pharynx: Oropharynx is clear. No oropharyngeal exudate or posterior oropharyngeal erythema.  Cardiovascular:     Rate and Rhythm: Normal rate and regular rhythm.     Heart sounds: No murmur heard.    No friction rub. No gallop.  Pulmonary:     Effort: Pulmonary effort is normal. No respiratory distress.     Breath sounds: Normal breath sounds. No wheezing, rhonchi or rales.  Abdominal:     General: Bowel sounds are normal. There is no distension.     Palpations: Abdomen is soft. There is no mass.     Tenderness: There is  no abdominal tenderness.  Musculoskeletal:        General: No swelling.     Right lower leg: No edema.     Left lower leg: No edema.  Lymphadenopathy:     Cervical: No cervical adenopathy.     Upper Body:     Right upper body: No supraclavicular or axillary adenopathy.     Left upper body: No supraclavicular or axillary adenopathy.     Lower Body: No right inguinal adenopathy. No left inguinal adenopathy.  Skin:    General: Skin is warm.     Coloration: Skin is not jaundiced.     Findings: No lesion or rash.  Neurological:     General: No focal deficit present.     Mental Status: She is alert and oriented to person, place, and time. Mental status is at baseline.  Psychiatric:        Mood and Affect: Mood normal.        Behavior: Behavior normal.        Thought Content: Thought content normal.    PATHOLOGY:  Her bone marrow biopsy revealed the following: DIAGNOSIS:   BONE MARROW, ASPIRATE, CLOT, CORE:  -Variably cellular bone marrow with dyspoietic changes  -See comment   PERIPHERAL BLOOD:  -Normocytic-normochromic anemia  -Neutrophilic left shift   COMMENT:   The bone marrow shows  dyspoietic changes involving myeloid cell lines  associated with abundant ring sideroblasts.  No increase in blastic  cells identified.  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  Correlation with  cytogenetic and FISH studies is recommended.   MICROSCOPIC DESCRIPTION:   PERIPHERAL BLOOD SMEAR: The red blood cells display moderate  anisocytosis with macrocytic and normocytic cells.  There is mild  poikilocytosis with elliptocytes, teardrop cells, and scattered  schistocytes.  There is mild polychromasia.  The white blood cells are  normal in number but with neutrophilic left shift.  Scattered mature  neutrophils are hypogranular.  The platelets are normal in number.   BONE MARROW ASPIRATE: Bone marrow particles present  Erythroid  precursors: Left shifted maturation.  Maturing cells display  nuclear cytoplasmic dyssynchrony or irregular/lobulated nuclei  Granulocytic precursors: Progressive maturation with dysgranulopoiesis.  Mature neutrophilic cells display hypogranulation and/or hypolobation.  No increase in blastic cells identified.  Megakaryocytes: Increased in number including numerous abnormal forms.  The latter consist of small hypolobated/unilobated cells in addition to  large hyperchromatic forms  Lymphocytes/plasma cells: Large aggregates not present   TOUCH PREPARATIONS: Mixture of cell-types present   CLOT AND BIOPSY: The sections show cellularity ranging from 20 to 60%  and shows a mixture of myeloid cell types including increased number of  megakaryocytes with abnormal forms.  Small clusters of immature  mononuclear cells are seen correlating with increased early erythroid  precursors seen in the aspirate.  A small interstitial lymphoid  aggregate composed of small lymphoid cells is seen admixed with  histiocytes.   IRON STAIN: Iron stains are performed on a bone marrow aspirate or touch  imprint smear and section of clot. The controls stained appropriately.        Storage Iron: Increased       Ring Sideroblasts: Abundant (more than 15% of erythroid precursors)   ADDITIONAL DATA/TESTING: The specimen was sent for cytogenetic analysis  and FISH for MDS and a separate report will follow.  Flow cytometric  analysis (WLS [phone]) failed to show any significant CD34 positive  blastic population.  Analysis of the lymphoid population shows  predominance of T cells with nonspecific changes.  No monoclonal B-cell  population identified.   CELL COUNT DATA:   Bone Marrow count performed on 500 cells shows:  Blasts:   0%   Myeloid:  57%  Promyelocytes: 0%   Erythroid:     30%  Myelocytes:    14%  Lymphocytes:   7%  Metamyelocytes:     4%   Plasma cells:  3%  Bands:    5%  Neutrophils:   28%  M:E  ratio:     1.9  Eosinophils:   6%  Basophils:     0%  Monocytes:     3%   Lab Data: CBC performed on 08/15/2021 shows:  WBC: 4.0 k/uL  Neutrophils:   51%            Myelos:    3%  Hgb: 7.5 g/dL  Lymphocytes:   25%  Metas:      2%  HCT: 22.2 %    Monocytes:     12%  MCV: 97 fL     Eosinophils:   7%  RDW: 19.3 %    Basophils:     0%  PLT: 224 k/uL       LABS:      Latest Ref Rng & Units 09/04/2021   12:00 AM 05/10/2021  12:00 AM 04/23/2021   12:00 AM  CBC  WBC  5.3     2.8     2.9   Hemoglobin 12.0 - 16.0 8.3     8.8     8.9   Hematocrit 36 - 46 '26     27     27   ' Platelets 150 - 400 K/uL 192     116     166      This result is from an external source.   ASSESSMENT & PLAN:  Assessment/Plan:  An 81 y.o. 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  In clinic today, I explained to the patient that myelodysplasia is a bone marrow disorder that prevents her from making an appropriate quality and quantity of blood cells.  As she does have the ring sideroblast subtype of myelodysplasia, there is potential for her to receive luspatercept in the near future.  However, for now, I will begin this patient on Retacrit, which she will take 20,000 units every 2 weeks.  The goal is to get her hemoglobin to/above 10.  Her CBC will be checked every 4 weeks to see how well she is responding to her Retacrit injections.  I will see her back in 8 weeks for repeat clinical assessment.  The patient and her daughter understand all the plans discussed today and are in agreement with them.    Cannon Quinton Macarthur Critchley, MD

## 2021-09-04 ENCOUNTER — Other Ambulatory Visit: Payer: Self-pay | Admitting: Oncology

## 2021-09-04 ENCOUNTER — Inpatient Hospital Stay: Payer: Medicare Other

## 2021-09-04 ENCOUNTER — Inpatient Hospital Stay: Payer: Medicare Other | Attending: Oncology | Admitting: Oncology

## 2021-09-04 VITALS — BP 179/77 | HR 78 | Temp 98.4°F | Resp 16 | Ht 62.0 in | Wt 131.9 lb

## 2021-09-04 DIAGNOSIS — D46B Refractory cytopenia with multilineage dysplasia and ring sideroblasts: Secondary | ICD-10-CM | POA: Diagnosis not present

## 2021-09-04 DIAGNOSIS — D46Z Other myelodysplastic syndromes: Secondary | ICD-10-CM

## 2021-09-04 DIAGNOSIS — D649 Anemia, unspecified: Secondary | ICD-10-CM

## 2021-09-04 DIAGNOSIS — D461 Refractory anemia with ring sideroblasts: Secondary | ICD-10-CM | POA: Insufficient documentation

## 2021-09-04 LAB — CBC AND DIFFERENTIAL
HCT: 26 — AB (ref 36–46)
Hemoglobin: 8.3 — AB (ref 12.0–16.0)
Neutrophils Absolute: 3.34
Platelets: 192 10*3/uL (ref 150–400)
WBC: 5.3

## 2021-09-04 LAB — CBC: RBC: 2.39 — AB (ref 3.87–5.11)

## 2021-09-09 ENCOUNTER — Encounter: Payer: Self-pay | Admitting: Oncology

## 2021-09-10 ENCOUNTER — Inpatient Hospital Stay: Payer: Medicare Other

## 2021-09-10 VITALS — BP 155/64 | HR 83 | Temp 99.4°F | Resp 16

## 2021-09-10 DIAGNOSIS — D461 Refractory anemia with ring sideroblasts: Secondary | ICD-10-CM | POA: Diagnosis present

## 2021-09-10 DIAGNOSIS — D46B Refractory cytopenia with multilineage dysplasia and ring sideroblasts: Secondary | ICD-10-CM

## 2021-09-10 MED ORDER — EPOETIN ALFA-EPBX 20000 UNIT/ML IJ SOLN
20000.0000 [IU] | Freq: Once | INTRAMUSCULAR | Status: AC
  Administered 2021-09-10: 20000 [IU] via SUBCUTANEOUS
  Filled 2021-09-10: qty 1

## 2021-09-10 NOTE — Patient Instructions (Signed)
Epoetin Alfa injection ?What is this medication? ?EPOETIN ALFA (e POE e tin AL fa) helps your body make more red blood cells. This medicine is used to treat anemia caused by chronic kidney disease, cancer chemotherapy, or HIV-therapy. It may also be used before surgery if you have anemia. ?This medicine may be used for other purposes; ask your health care provider or pharmacist if you have questions. ?COMMON BRAND NAME(S): Epogen, Procrit, Retacrit ?What should I tell my care team before I take this medication? ?They need to know if you have any of these conditions: ?cancer ?heart disease ?high blood pressure ?history of blood clots ?history of stroke ?low levels of folate, iron, or vitamin B12 in the blood ?seizures ?an unusual or allergic reaction to erythropoietin, albumin, benzyl alcohol, hamster proteins, other medicines, foods, dyes, or preservatives ?pregnant or trying to get pregnant ?breast-feeding ?How should I use this medication? ?This medicine is for injection into a vein or under the skin. It is usually given by a health care professional in a hospital or clinic setting. ?If you get this medicine at home, you will be taught how to prepare and give this medicine. Use exactly as directed. Take your medicine at regular intervals. Do not take your medicine more often than directed. ?It is important that you put your used needles and syringes in a special sharps container. Do not put them in a trash can. If you do not have a sharps container, call your pharmacist or healthcare provider to get one. ?A special MedGuide will be given to you by the pharmacist with each prescription and refill. Be sure to read this information carefully each time. ?Talk to your pediatrician regarding the use of this medicine in children. While this drug may be prescribed for selected conditions, precautions do apply. ?Overdosage: If you think you have taken too much of this medicine contact a poison control center or emergency  room at once. ?NOTE: This medicine is only for you. Do not share this medicine with others. ?What if I miss a dose? ?If you miss a dose, take it as soon as you can. If it is almost time for your next dose, take only that dose. Do not take double or extra doses. ?What may interact with this medication? ?Interactions have not been studied. ?This list may not describe all possible interactions. Give your health care provider a list of all the medicines, herbs, non-prescription drugs, or dietary supplements you use. Also tell them if you smoke, drink alcohol, or use illegal drugs. Some items may interact with your medicine. ?What should I watch for while using this medication? ?Your condition will be monitored carefully while you are receiving this medicine. ?You may need blood work done while you are taking this medicine. ?This medicine may cause a decrease in vitamin B6. You should make sure that you get enough vitamin B6 while you are taking this medicine. Discuss the foods you eat and the vitamins you take with your health care professional. ?What side effects may I notice from receiving this medication? ?Side effects that you should report to your doctor or health care professional as soon as possible: ?allergic reactions like skin rash, itching or hives, swelling of the face, lips, or tongue ?seizures ?signs and symptoms of a blood clot such as breathing problems; changes in vision; chest pain; severe, sudden headache; pain, swelling, warmth in the leg; trouble speaking; sudden numbness or weakness of the face, arm or leg ?signs and symptoms of a stroke like   changes in vision; confusion; trouble speaking or understanding; severe headaches; sudden numbness or weakness of the face, arm or leg; trouble walking; dizziness; loss of balance or coordination ?Side effects that usually do not require medical attention (report to your doctor or health care professional if they continue or are  bothersome): ?chills ?cough ?dizziness ?fever ?headaches ?joint pain ?muscle cramps ?muscle pain ?nausea, vomiting ?pain, redness, or irritation at site where injected ?This list may not describe all possible side effects. Call your doctor for medical advice about side effects. You may report side effects to FDA at 1-800-FDA-1088. ?Where should I keep my medication? ?Keep out of the reach of children. ?Store in a refrigerator between 2 and 8 degrees C (36 and 46 degrees F). Do not freeze or shake. Throw away any unused portion if using a single-dose vial. Multi-dose vials can be kept in the refrigerator for up to 21 days after the initial dose. Throw away unused medicine. ?NOTE: This sheet is a summary. It may not cover all possible information. If you have questions about this medicine, talk to your doctor, pharmacist, or health care provider. ?? 2023 Elsevier/Gold Standard (2016-10-15 00:00:00) ? ?

## 2021-09-21 ENCOUNTER — Inpatient Hospital Stay: Payer: Medicare Other

## 2021-09-21 ENCOUNTER — Other Ambulatory Visit: Payer: Self-pay | Admitting: Hematology and Oncology

## 2021-09-21 DIAGNOSIS — D46Z Other myelodysplastic syndromes: Secondary | ICD-10-CM

## 2021-09-21 DIAGNOSIS — D649 Anemia, unspecified: Secondary | ICD-10-CM

## 2021-09-21 LAB — CBC AND DIFFERENTIAL
HCT: 24 — AB (ref 36–46)
Hemoglobin: 7.5 — AB (ref 12.0–16.0)
Neutrophils Absolute: 3.38
Platelets: 214 10*3/uL (ref 150–400)
WBC: 6.5

## 2021-09-21 LAB — CBC: RBC: 2.2 — AB (ref 3.87–5.11)

## 2021-09-21 NOTE — Progress Notes (Signed)
MD aware of lab results

## 2021-09-24 ENCOUNTER — Other Ambulatory Visit: Payer: Self-pay | Admitting: Hematology and Oncology

## 2021-09-24 ENCOUNTER — Inpatient Hospital Stay: Payer: Medicare Other

## 2021-09-24 ENCOUNTER — Encounter: Payer: Self-pay | Admitting: Oncology

## 2021-09-24 VITALS — BP 162/81 | HR 77 | Temp 99.1°F | Resp 20 | Ht 62.0 in | Wt 144.8 lb

## 2021-09-24 DIAGNOSIS — D46B Refractory cytopenia with multilineage dysplasia and ring sideroblasts: Secondary | ICD-10-CM

## 2021-09-24 DIAGNOSIS — D649 Anemia, unspecified: Secondary | ICD-10-CM

## 2021-09-24 DIAGNOSIS — D461 Refractory anemia with ring sideroblasts: Secondary | ICD-10-CM | POA: Diagnosis not present

## 2021-09-24 LAB — CBC: RBC: 2.25 — AB (ref 3.87–5.11)

## 2021-09-24 LAB — CBC AND DIFFERENTIAL
HCT: 24 — AB (ref 36–46)
Hemoglobin: 7.5 — AB (ref 12.0–16.0)
Neutrophils Absolute: 2.81
Platelets: 167 10*3/uL (ref 150–400)
WBC: 5.2

## 2021-09-24 LAB — PREPARE RBC (CROSSMATCH)

## 2021-09-24 MED ORDER — EPOETIN ALFA-EPBX 40000 UNIT/ML IJ SOLN
40000.0000 [IU] | Freq: Once | INTRAMUSCULAR | Status: AC
  Administered 2021-09-24: 40000 [IU] via SUBCUTANEOUS
  Filled 2021-09-24: qty 1

## 2021-09-24 MED ORDER — EPOETIN ALFA-EPBX 20000 UNIT/ML IJ SOLN: 20000.0000 [IU] | Freq: Once | INTRAMUSCULAR | Status: DC

## 2021-09-24 NOTE — Patient Instructions (Signed)
Epoetin Alfa injection ?What is this medication? ?EPOETIN ALFA (e POE e tin AL fa) helps your body make more red blood cells. This medicine is used to treat anemia caused by chronic kidney disease, cancer chemotherapy, or HIV-therapy. It may also be used before surgery if you have anemia. ?This medicine may be used for other purposes; ask your health care provider or pharmacist if you have questions. ?COMMON BRAND NAME(S): Epogen, Procrit, Retacrit ?What should I tell my care team before I take this medication? ?They need to know if you have any of these conditions: ?cancer ?heart disease ?high blood pressure ?history of blood clots ?history of stroke ?low levels of folate, iron, or vitamin B12 in the blood ?seizures ?an unusual or allergic reaction to erythropoietin, albumin, benzyl alcohol, hamster proteins, other medicines, foods, dyes, or preservatives ?pregnant or trying to get pregnant ?breast-feeding ?How should I use this medication? ?This medicine is for injection into a vein or under the skin. It is usually given by a health care professional in a hospital or clinic setting. ?If you get this medicine at home, you will be taught how to prepare and give this medicine. Use exactly as directed. Take your medicine at regular intervals. Do not take your medicine more often than directed. ?It is important that you put your used needles and syringes in a special sharps container. Do not put them in a trash can. If you do not have a sharps container, call your pharmacist or healthcare provider to get one. ?A special MedGuide will be given to you by the pharmacist with each prescription and refill. Be sure to read this information carefully each time. ?Talk to your pediatrician regarding the use of this medicine in children. While this drug may be prescribed for selected conditions, precautions do apply. ?Overdosage: If you think you have taken too much of this medicine contact a poison control center or emergency  room at once. ?NOTE: This medicine is only for you. Do not share this medicine with others. ?What if I miss a dose? ?If you miss a dose, take it as soon as you can. If it is almost time for your next dose, take only that dose. Do not take double or extra doses. ?What may interact with this medication? ?Interactions have not been studied. ?This list may not describe all possible interactions. Give your health care provider a list of all the medicines, herbs, non-prescription drugs, or dietary supplements you use. Also tell them if you smoke, drink alcohol, or use illegal drugs. Some items may interact with your medicine. ?What should I watch for while using this medication? ?Your condition will be monitored carefully while you are receiving this medicine. ?You may need blood work done while you are taking this medicine. ?This medicine may cause a decrease in vitamin B6. You should make sure that you get enough vitamin B6 while you are taking this medicine. Discuss the foods you eat and the vitamins you take with your health care professional. ?What side effects may I notice from receiving this medication? ?Side effects that you should report to your doctor or health care professional as soon as possible: ?allergic reactions like skin rash, itching or hives, swelling of the face, lips, or tongue ?seizures ?signs and symptoms of a blood clot such as breathing problems; changes in vision; chest pain; severe, sudden headache; pain, swelling, warmth in the leg; trouble speaking; sudden numbness or weakness of the face, arm or leg ?signs and symptoms of a stroke like   changes in vision; confusion; trouble speaking or understanding; severe headaches; sudden numbness or weakness of the face, arm or leg; trouble walking; dizziness; loss of balance or coordination ?Side effects that usually do not require medical attention (report to your doctor or health care professional if they continue or are  bothersome): ?chills ?cough ?dizziness ?fever ?headaches ?joint pain ?muscle cramps ?muscle pain ?nausea, vomiting ?pain, redness, or irritation at site where injected ?This list may not describe all possible side effects. Call your doctor for medical advice about side effects. You may report side effects to FDA at 1-800-FDA-1088. ?Where should I keep my medication? ?Keep out of the reach of children. ?Store in a refrigerator between 2 and 8 degrees C (36 and 46 degrees F). Do not freeze or shake. Throw away any unused portion if using a single-dose vial. Multi-dose vials can be kept in the refrigerator for up to 21 days after the initial dose. Throw away unused medicine. ?NOTE: This sheet is a summary. It may not cover all possible information. If you have questions about this medicine, talk to your doctor, pharmacist, or health care provider. ?? 2023 Elsevier/Gold Standard (2016-10-15 00:00:00) ? ?

## 2021-09-25 ENCOUNTER — Inpatient Hospital Stay: Payer: Medicare Other | Attending: Oncology

## 2021-09-25 ENCOUNTER — Other Ambulatory Visit: Payer: Self-pay | Admitting: Pharmacist

## 2021-09-25 DIAGNOSIS — D461 Refractory anemia with ring sideroblasts: Secondary | ICD-10-CM | POA: Diagnosis not present

## 2021-09-25 DIAGNOSIS — D46B Refractory cytopenia with multilineage dysplasia and ring sideroblasts: Secondary | ICD-10-CM

## 2021-09-25 MED ORDER — SODIUM CHLORIDE 0.9% IV SOLUTION
250.0000 mL | Freq: Once | INTRAVENOUS | Status: AC
  Administered 2021-09-25: 250 mL via INTRAVENOUS

## 2021-09-25 MED ORDER — ACETAMINOPHEN 325 MG PO TABS
650.0000 mg | ORAL_TABLET | Freq: Once | ORAL | Status: AC
  Administered 2021-09-25: 650 mg via ORAL
  Filled 2021-09-25: qty 2

## 2021-09-25 MED ORDER — DIPHENHYDRAMINE HCL 25 MG PO CAPS
25.0000 mg | ORAL_CAPSULE | Freq: Once | ORAL | Status: AC
  Administered 2021-09-25: 25 mg via ORAL
  Filled 2021-09-25: qty 1

## 2021-09-25 NOTE — Patient Instructions (Signed)
Blood Transfusion, Adult, Care After ?This sheet gives you information about how to care for yourself after your procedure. Your health care provider may also give you more specific instructions. If you have problems or questions, contact your health care provider. ?What can I expect after the procedure? ?After the procedure, it is common to have: ?Bruising and soreness where the IV was inserted. ?A headache. ?Follow these instructions at home: ?IV insertion site care ? ?  ? ?Follow instructions from your health care provider about how to take care of your IV insertion site. Make sure you: ?Wash your hands with soap and water before and after you change your bandage (dressing). If soap and water are not available, use hand sanitizer. ?Change your dressing as told by your health care provider. ?Check your IV insertion site every day for signs of infection. Check for: ?Redness, swelling, or pain. ?Bleeding from the site. ?Warmth. ?Pus or a bad smell. ?General instructions ?Take over-the-counter and prescription medicines only as told by your health care provider. ?Rest as told by your health care provider. ?Return to your normal activities as told by your health care provider. ?Keep all follow-up visits as told by your health care provider. This is important. ?Contact a health care provider if: ?You have itching or red, swollen areas of skin (hives). ?You feel anxious. ?You feel weak after doing your normal activities. ?You have redness, swelling, warmth, or pain around the IV insertion site. ?You have blood coming from the IV insertion site that does not stop with pressure. ?You have pus or a bad smell coming from your IV insertion site. ?Get help right away if: ?You have symptoms of a serious allergic or immune system reaction, including: ?Trouble breathing or shortness of breath. ?Swelling of the face or feeling flushed. ?Fever or chills. ?Pain in the head, back, or chest. ?Dark urine or blood in the  urine. ?Widespread rash. ?Fast heartbeat. ?Feeling dizzy or light-headed. ?If you receive your blood transfusion in an outpatient setting, you will be told whom to contact to report any reactions. ?These symptoms may represent a serious problem that is an emergency. Do not wait to see if the symptoms will go away. Get medical help right away. Call your local emergency services (911 in the [place] Do not drive yourself to the hospital. ?Summary ?Bruising and tenderness around the IV insertion site are common. ?Check your IV insertion site every day for signs of infection. ?Rest as told by your health care provider. Return to your normal activities as told by your health care provider. ?Get help right away for symptoms of a serious allergic or immune system reaction to blood transfusion. ?This information is not intended to replace advice given to you by your health care provider. Make sure you discuss any questions you have with your health care provider. ?Document Revised: 06/08/2020 Document Reviewed: 08/06/2018 ?Elsevier Patient Education ? 2023 Elsevier Inc. ? ?

## 2021-09-25 NOTE — Progress Notes (Signed)
AFTER FIRST UNIT OF PACKED RED BLOOD CELLS, PT'S IV BECAME INFILTRATED.  REMOVED IV AND ATTEMPTED TO RESTART BY 3 NURSES WITH 2 STICKS EACH.  UNABLE TO OBTAIN IV ACCESS.  PT AND DAUGHTER AWARE AND AGREEABLE TO PT GETTING ONE UNIT OF BLOOD TODAY, AND WILL NOT ATTEMPT FURTHER IV ACCESS.  THIS INFO RELAYED TO MELLISSA NP.  BLOOD RETURNED TO BLOOD BANK

## 2021-09-26 LAB — BPAM RBC
Blood Product Expiration Date: 202308312359
Blood Product Expiration Date: 202308312359
ISSUE DATE / TIME: 202308010814
ISSUE DATE / TIME: 202308012129
Unit Type and Rh: 5100
Unit Type and Rh: 5100

## 2021-09-26 LAB — TYPE AND SCREEN
ABO/RH(D): O POS
Antibody Screen: NEGATIVE
Unit division: 0
Unit division: 0

## 2021-10-08 ENCOUNTER — Inpatient Hospital Stay: Payer: Medicare Other

## 2021-10-08 VITALS — BP 134/58 | HR 77 | Temp 98.1°F | Resp 18 | Wt 143.4 lb

## 2021-10-08 DIAGNOSIS — D46B Refractory cytopenia with multilineage dysplasia and ring sideroblasts: Secondary | ICD-10-CM

## 2021-10-08 DIAGNOSIS — D46Z Other myelodysplastic syndromes: Secondary | ICD-10-CM

## 2021-10-08 DIAGNOSIS — D461 Refractory anemia with ring sideroblasts: Secondary | ICD-10-CM | POA: Diagnosis not present

## 2021-10-08 LAB — CBC AND DIFFERENTIAL
HCT: 26 — AB (ref 36–46)
Hemoglobin: 8.3 — AB (ref 12.0–16.0)
Neutrophils Absolute: 3.22
Platelets: 152 10*3/uL (ref 150–400)
WBC: 4.6

## 2021-10-08 LAB — CBC: RBC: 2.53 — AB (ref 3.87–5.11)

## 2021-10-08 MED ORDER — EPOETIN ALFA-EPBX 20000 UNIT/ML IJ SOLN
20000.0000 [IU] | Freq: Once | INTRAMUSCULAR | Status: AC
  Administered 2021-10-08: 20000 [IU] via SUBCUTANEOUS
  Filled 2021-10-08: qty 1

## 2021-10-08 NOTE — Patient Instructions (Signed)

## 2021-10-22 ENCOUNTER — Inpatient Hospital Stay: Payer: Medicare Other

## 2021-10-22 VITALS — HR 77 | Temp 98.8°F | Resp 20 | Ht 62.0 in | Wt 143.2 lb

## 2021-10-22 DIAGNOSIS — D461 Refractory anemia with ring sideroblasts: Secondary | ICD-10-CM | POA: Diagnosis not present

## 2021-10-22 DIAGNOSIS — D46B Refractory cytopenia with multilineage dysplasia and ring sideroblasts: Secondary | ICD-10-CM

## 2021-10-22 DIAGNOSIS — D649 Anemia, unspecified: Secondary | ICD-10-CM

## 2021-10-22 LAB — CBC AND DIFFERENTIAL
HCT: 24 — AB (ref 36–46)
Hemoglobin: 7.8 — AB (ref 12.0–16.0)
Neutrophils Absolute: 4.64
Platelets: 141 10*3/uL — AB (ref 150–400)
WBC: 5.4

## 2021-10-22 LAB — BASIC METABOLIC PANEL
BUN: 38 — AB (ref 4–21)
CO2: 22 (ref 13–22)
Chloride: 109 — AB (ref 99–108)
Creatinine: 1.3 — AB (ref 0.5–1.1)
Glucose: 103
Potassium: 5.1 mEq/L (ref 3.5–5.1)
Sodium: 142 (ref 137–147)

## 2021-10-22 LAB — HEPATIC FUNCTION PANEL
ALT: 17 U/L (ref 7–35)
AST: 26 (ref 13–35)
Alkaline Phosphatase: 170 — AB (ref 25–125)
Bilirubin, Total: 0.9

## 2021-10-22 LAB — COMPREHENSIVE METABOLIC PANEL WITH GFR
Albumin: 4.1 (ref 3.5–5.0)
Calcium: 8.9 (ref 8.7–10.7)

## 2021-10-22 LAB — FOLATE: Folate: >40 ng/mL (ref >5.9)

## 2021-10-22 LAB — CBC: RBC: 2.38 — AB (ref 3.87–5.11)

## 2021-10-22 LAB — VITAMIN B12: Vitamin B-12: 783 pg/mL (ref 180–914)

## 2021-10-22 MED ORDER — EPOETIN ALFA-EPBX 20000 UNIT/ML IJ SOLN
20000.0000 [IU] | Freq: Once | INTRAMUSCULAR | Status: AC
  Administered 2021-10-22: 20000 [IU] via SUBCUTANEOUS
  Filled 2021-10-22: qty 1

## 2021-10-22 NOTE — Patient Instructions (Signed)

## 2021-11-04 NOTE — Progress Notes (Signed)
[facility]  [address] 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  Over these past weeks, the patient has been receiving Retacrit with the hopes of this agent improving her anemia over time.  She comes in today to reassess her myelodysplasia.  Despite receiving her Retacrit injections every 2 weeks, the patient still complains of feeling very weak on a daily basis.    PHYSICAL EXAM:  Blood pressure (!) 188/79, pulse 73, temperature 98.2 F (36.8 C), resp. rate 14, height '5\' 2"'  (1.575 m), weight 150 lb 6.4 oz (68.2 kg), SpO2 96 %. Wt Readings from Last 3 Encounters:  11/05/21 150 lb 6.4 oz (68.2 kg)  10/22/21 143 lb 4 oz (65 kg)  10/08/21 143 lb 6.4 oz (65 kg)   Body mass index is 27.51 kg/m. Performance status (ECOG): 2 - Symptomatic, <50% confined to bed Physical Exam Constitutional:      Appearance: Normal appearance. She is not ill-appearing.  HENT:     Mouth/Throat:     Mouth: Mucous membranes are moist.     Pharynx: Oropharynx is clear. No oropharyngeal exudate or posterior oropharyngeal erythema.  Cardiovascular:     Rate and Rhythm: Normal rate and regular rhythm.     Heart sounds: No murmur heard.    No friction rub. No gallop.  Pulmonary:     Effort: Pulmonary effort is normal. No respiratory distress.     Breath sounds: Normal breath sounds. No wheezing, rhonchi or rales.  Abdominal:     General: Bowel sounds are normal. There is no distension.     Palpations: Abdomen is soft. There is no mass.     Tenderness: There is no abdominal tenderness.  Musculoskeletal:        General: No swelling.     Right lower leg: No edema.     Left lower leg: No edema.  Lymphadenopathy:     Cervical: No cervical adenopathy.     Upper Body:     Right upper body: No  supraclavicular or axillary adenopathy.     Left upper body: No supraclavicular or axillary adenopathy.     Lower Body: No right inguinal adenopathy. No left inguinal adenopathy.  Skin:    General: Skin is warm.     Coloration: Skin is not jaundiced.     Findings: No lesion or rash.  Neurological:     General: No focal deficit present.     Mental Status: She is alert and oriented to person, place, and time. Mental status is at baseline.  Psychiatric:        Mood and Affect: Mood normal.        Behavior: Behavior normal.        Thought Content: Thought content normal.  LABS:   PATHOLOGY:  Her bone marrow biopsy revealed the following: DIAGNOSIS:   BONE MARROW, ASPIRATE, CLOT, CORE:  -Variably cellular bone marrow with dyspoietic changes  -See comment   PERIPHERAL BLOOD:  -Normocytic-normochromic anemia  -Neutrophilic left shift   COMMENT:   The bone marrow shows dyspoietic changes involving myeloid cell lines  associated with abundant ring sideroblasts.  No increase in blastic  cells identified.  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  Correlation with  cytogenetic and FISH studies is recommended.  MICROSCOPIC DESCRIPTION:   PERIPHERAL BLOOD SMEAR: The red blood cells display moderate  anisocytosis with macrocytic and normocytic cells.  There is mild  poikilocytosis with elliptocytes, teardrop cells, and scattered  schistocytes.  There is mild polychromasia.  The white blood cells are  normal in number but with neutrophilic left shift.  Scattered mature  neutrophils are hypogranular.  The platelets are normal in number.   BONE MARROW ASPIRATE: Bone marrow particles present  Erythroid precursors: Left shifted maturation.  Maturing cells display  nuclear cytoplasmic dyssynchrony or irregular/lobulated nuclei  Granulocytic precursors: Progressive maturation with dysgranulopoiesis.  Mature neutrophilic cells display  hypogranulation and/or hypolobation.  No increase in blastic cells identified.  Megakaryocytes: Increased in number including numerous abnormal forms.  The latter consist of small hypolobated/unilobated cells in addition to  large hyperchromatic forms  Lymphocytes/plasma cells: Large aggregates not present   TOUCH PREPARATIONS: Mixture of cell-types present   CLOT AND BIOPSY: The sections show cellularity ranging from 20 to 60%  and shows a mixture of myeloid cell types including increased number of  megakaryocytes with abnormal forms.  Small clusters of immature  mononuclear cells are seen correlating with increased early erythroid  precursors seen in the aspirate.  A small interstitial lymphoid  aggregate composed of small lymphoid cells is seen admixed with  histiocytes.   IRON STAIN: Iron stains are performed on a bone marrow aspirate or touch  imprint smear and section of clot. The controls stained appropriately.        Storage Iron: Increased       Ring Sideroblasts: Abundant (more than 15% of erythroid precursors)   ADDITIONAL DATA/TESTING: The specimen was sent for cytogenetic analysis  and FISH for MDS and a separate report will follow.  Flow cytometric  analysis ([facility] [phone]) failed to show any significant CD34 positive  blastic population.  Analysis of the lymphoid population shows  predominance of T cells with nonspecific changes.  No monoclonal B-cell  population identified.   CELL COUNT DATA:   Bone Marrow count performed on 500 cells shows:  Blasts:   0%   Myeloid:  57%  Promyelocytes: 0%   Erythroid:     30%  Myelocytes:    14%  Lymphocytes:   7%  Metamyelocytes:     4%   Plasma cells:  3%  Bands:    5%  Neutrophils:   28%  M:E ratio:     1.9  Eosinophils:   6%  Basophils:     0%  Monocytes:     3%   Lab Data: CBC performed on 08/15/2021 shows:  WBC: 4.0 k/uL  Neutrophils:   51%            Myelos:    3%  Hgb: 7.5 g/dL  Lymphocytes:   25%  Metas:      2%   HCT: 22.2 %    Monocytes:     12%  MCV: 97 fL     Eosinophils:   7%  RDW: 19.3 %    Basophils:     0%  PLT: 224 k/uL       ASSESSMENT & PLAN:  Assessment/Plan:  An 81 y.o. 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  In clinic today, I explained to the patient that myelodysplasia is a bone marrow disorder that prevents her from making an appropriate quality and quantity of blood cells.  As she does have the ring sideroblast subtype of myelodysplasia, there is potential for her to receive luspatercept  in the near future.  However, for now, I will begin this patient on Retacrit, which she will take 20,000 units every 2 weeks.  The goal is to get her hemoglobin to/above 10.  Her CBC will be checked every 4 weeks to see how well she is responding to her Retacrit injections.  I will see her back in 8 weeks for repeat clinical assessment.  The patient and her daughter understand all the plans discussed today and are in agreement with them.    Brandonlee Navis Macarthur Critchley, MD

## 2021-11-05 ENCOUNTER — Inpatient Hospital Stay: Payer: Medicare Other

## 2021-11-05 ENCOUNTER — Other Ambulatory Visit: Payer: Self-pay | Admitting: Pharmacist

## 2021-11-05 ENCOUNTER — Inpatient Hospital Stay: Payer: Medicare Other | Attending: Oncology | Admitting: Oncology

## 2021-11-05 ENCOUNTER — Telehealth: Payer: Self-pay

## 2021-11-05 VITALS — BP 188/79 | HR 73 | Temp 98.2°F | Resp 14 | Ht 62.0 in | Wt 150.4 lb

## 2021-11-05 DIAGNOSIS — D461 Refractory anemia with ring sideroblasts: Secondary | ICD-10-CM | POA: Insufficient documentation

## 2021-11-05 DIAGNOSIS — D46B Refractory cytopenia with multilineage dysplasia and ring sideroblasts: Secondary | ICD-10-CM

## 2021-11-05 DIAGNOSIS — D46Z Other myelodysplastic syndromes: Secondary | ICD-10-CM

## 2021-11-05 LAB — CBC: RBC: 2.04 — AB (ref 3.87–5.11)

## 2021-11-05 LAB — CBC AND DIFFERENTIAL
HCT: 21 — AB (ref 36–46)
Hemoglobin: 6.5 — AB (ref 12.0–16.0)
Neutrophils Absolute: 2.48
Platelets: 115 10*3/uL — AB (ref 150–400)
WBC: 4.2

## 2021-11-05 NOTE — Telephone Encounter (Signed)
CRITICAL VALUE STICKER  CRITICAL VALUE: HGB 6.5  RECEIVER (on-site recipient of call): Levada Dy, [place] NOTIFIED: 11/05/21 1408  MESSENGER (representative from lab): Caryl Pina from [facility] hematology  MD NOTIFIED: Dr. Bobby Rumpf  TIME OF NOTIFICATION: 9179  RESPONSE:  Will see patient.

## 2021-11-06 ENCOUNTER — Other Ambulatory Visit: Payer: Self-pay

## 2021-11-07 ENCOUNTER — Other Ambulatory Visit: Payer: Self-pay

## 2021-11-07 ENCOUNTER — Ambulatory Visit: Payer: Medicare Other

## 2021-11-09 MED FILL — Luspatercept-aamt For Subcutaneous Inj 75 MG: SUBCUTANEOUS | Qty: 1.5 | Status: AC

## 2021-11-10 ENCOUNTER — Encounter: Payer: Self-pay | Admitting: Oncology

## 2021-11-12 ENCOUNTER — Inpatient Hospital Stay: Payer: Medicare Other

## 2021-11-12 VITALS — BP 170/65 | HR 60 | Temp 98.3°F | Resp 18 | Ht 62.0 in | Wt 150.1 lb

## 2021-11-12 DIAGNOSIS — D461 Refractory anemia with ring sideroblasts: Secondary | ICD-10-CM | POA: Diagnosis present

## 2021-11-12 DIAGNOSIS — D46B Refractory cytopenia with multilineage dysplasia and ring sideroblasts: Secondary | ICD-10-CM

## 2021-11-12 LAB — CBC: RBC: 2.74 — AB (ref 3.87–5.11)

## 2021-11-12 LAB — CBC AND DIFFERENTIAL
HCT: 27 — AB (ref 36–46)
Hemoglobin: 8.6 — AB (ref 12.0–16.0)
Neutrophils Absolute: 2.13
Platelets: 101 10*3/uL — AB (ref 150–400)
WBC: 4.1

## 2021-11-12 MED ORDER — LUSPATERCEPT-AAMT 75 MG ~~LOC~~ SOLR
1.1500 mg/kg | Freq: Once | SUBCUTANEOUS | Status: AC
  Administered 2021-11-12: 75 mg via SUBCUTANEOUS
  Filled 2021-11-12: qty 1.5

## 2021-11-12 NOTE — Patient Instructions (Signed)
Luspatercept Injection What is this medication? LUSPATERCEPT (lus PAT er sept) treats low levels of red blood cells (anemia) in the body in people with beta thalassemia or myelodysplastic syndromes. It works by helping the body make more red blood cells. This medicine may be used for other purposes; ask your health care provider or pharmacist if you have questions. COMMON BRAND NAME(S): REBLOZYL What should I tell my care team before I take this medication? They need to know if you have any of these conditions: Have had your spleen removed High blood pressure History of blood clots Tobacco use An unusual or allergic reaction to luspatercept, other medications, foods, dyes or preservatives Pregnant or trying to get pregnant Breast-feeding How should I use this medication? This medication is for injection under the skin. It is given by your care team in a hospital or clinic setting. Talk to your care team about the use of the medication in children. This medication is not approved for use in children. Overdosage: If you think you have taken too much of this medicine contact a poison control center or emergency room at once. NOTE: This medicine is only for you. Do not share this medicine with others. What if I miss a dose? Keep appointments for follow-up doses. It is important not to miss your dose. Call your care team if you are unable to keep an appointment. What may interact with this medication? Interactions are not expected. This list may not describe all possible interactions. Give your health care provider a list of all the medicines, herbs, non-prescription drugs, or dietary supplements you use. Also tell them if you smoke, drink alcohol, or use illegal drugs. Some items may interact with your medicine. What should I watch for while using this medication? Your condition will be monitored carefully while you are receiving this medication. Talk to your care team if you wish to become  pregnant or think you might be pregnant. This medication can cause serious birth defects. Discuss contraceptive options with your care team. Do not breastfeed while taking this medication. You may need blood work done while you are taking this medication. What side effects may I notice from receiving this medication? Side effects that you should report to your care team as soon as possible: Allergic reactions--skin rash, itching, hives, swelling of the face, lips, tongue, or throat Blood clot--pain, swelling, or warmth in the leg, shortness of breath, chest pain Increase in blood pressure Severe back pain, numbness or weakness of the hands, arms, legs, or feet, loss of coordination, loss of bowel or bladder control Side effects that usually do not require medical attention (report these to your care team if they continue or are bothersome): Bone pain Dizziness Fatigue Headache Joint pain Muscle pain Stomach pain This list may not describe all possible side effects. Call your doctor for medical advice about side effects. You may report side effects to FDA at 1-800-FDA-1088. Where should I keep my medication? This medication is given in a hospital or clinic. It will not be stored at home. NOTE: This sheet is a summary. It may not cover all possible information. If you have questions about this medicine, talk to your doctor, pharmacist, or health care provider.  2023 Elsevier/Gold Standard (2020-09-08 00:00:00)  

## 2021-11-19 ENCOUNTER — Ambulatory Visit: Payer: Medicare Other

## 2021-11-19 ENCOUNTER — Other Ambulatory Visit: Payer: Medicare Other

## 2021-11-26 ENCOUNTER — Other Ambulatory Visit: Payer: Self-pay

## 2021-12-01 ENCOUNTER — Other Ambulatory Visit: Payer: Self-pay

## 2021-12-03 ENCOUNTER — Inpatient Hospital Stay: Payer: Medicare Other

## 2021-12-03 ENCOUNTER — Inpatient Hospital Stay: Payer: Medicare Other | Attending: Oncology

## 2021-12-03 DIAGNOSIS — Z79899 Other long term (current) drug therapy: Secondary | ICD-10-CM | POA: Insufficient documentation

## 2021-12-03 DIAGNOSIS — D46B Refractory cytopenia with multilineage dysplasia and ring sideroblasts: Secondary | ICD-10-CM

## 2021-12-03 DIAGNOSIS — D461 Refractory anemia with ring sideroblasts: Secondary | ICD-10-CM | POA: Insufficient documentation

## 2021-12-03 LAB — CBC AND DIFFERENTIAL
HCT: 28 — AB (ref 36–46)
Hemoglobin: 9 — AB (ref 12.0–16.0)
Neutrophils Absolute: 4.65
Platelets: 246 10*3/uL (ref 150–400)
WBC: 8.3

## 2021-12-03 LAB — CBC: RBC: 2.88 — AB (ref 3.87–5.11)

## 2021-12-03 MED FILL — Luspatercept-aamt For Subcutaneous Inj 75 MG: SUBCUTANEOUS | Qty: 1.5 | Status: AC

## 2021-12-04 ENCOUNTER — Inpatient Hospital Stay: Payer: Medicare Other

## 2021-12-04 VITALS — BP 126/50 | HR 80 | Temp 98.1°F | Resp 18 | Ht 62.0 in | Wt 144.8 lb

## 2021-12-04 DIAGNOSIS — D461 Refractory anemia with ring sideroblasts: Secondary | ICD-10-CM | POA: Diagnosis not present

## 2021-12-04 DIAGNOSIS — D46B Refractory cytopenia with multilineage dysplasia and ring sideroblasts: Secondary | ICD-10-CM

## 2021-12-04 DIAGNOSIS — Z79899 Other long term (current) drug therapy: Secondary | ICD-10-CM | POA: Diagnosis not present

## 2021-12-04 MED ORDER — LUSPATERCEPT-AAMT 75 MG ~~LOC~~ SOLR
1.1500 mg/kg | Freq: Once | SUBCUTANEOUS | Status: AC
  Administered 2021-12-04: 75 mg via SUBCUTANEOUS
  Filled 2021-12-04: qty 1.5

## 2021-12-04 NOTE — Patient Instructions (Signed)
Luspatercept Injection What is this medication? LUSPATERCEPT (lus PAT er sept) treats low levels of red blood cells (anemia) in the body in people with beta thalassemia or myelodysplastic syndromes. It works by helping the body make more red blood cells. This medicine may be used for other purposes; ask your health care provider or pharmacist if you have questions. COMMON BRAND NAME(S): REBLOZYL What should I tell my care team before I take this medication? They need to know if you have any of these conditions: Have had your spleen removed High blood pressure History of blood clots Tobacco use An unusual or allergic reaction to luspatercept, other medications, foods, dyes or preservatives Pregnant or trying to get pregnant Breast-feeding How should I use this medication? This medication is for injection under the skin. It is given by your care team in a hospital or clinic setting. Talk to your care team about the use of the medication in children. This medication is not approved for use in children. Overdosage: If you think you have taken too much of this medicine contact a poison control center or emergency room at once. NOTE: This medicine is only for you. Do not share this medicine with others. What if I miss a dose? Keep appointments for follow-up doses. It is important not to miss your dose. Call your care team if you are unable to keep an appointment. What may interact with this medication? Interactions are not expected. This list may not describe all possible interactions. Give your health care provider a list of all the medicines, herbs, non-prescription drugs, or dietary supplements you use. Also tell them if you smoke, drink alcohol, or use illegal drugs. Some items may interact with your medicine. What should I watch for while using this medication? Your condition will be monitored carefully while you are receiving this medication. Talk to your care team if you wish to become  pregnant or think you might be pregnant. This medication can cause serious birth defects. Discuss contraceptive options with your care team. Do not breastfeed while taking this medication. You may need blood work done while you are taking this medication. What side effects may I notice from receiving this medication? Side effects that you should report to your care team as soon as possible: Allergic reactions--skin rash, itching, hives, swelling of the face, lips, tongue, or throat Blood clot--pain, swelling, or warmth in the leg, shortness of breath, chest pain Increase in blood pressure Severe back pain, numbness or weakness of the hands, arms, legs, or feet, loss of coordination, loss of bowel or bladder control Side effects that usually do not require medical attention (report these to your care team if they continue or are bothersome): Bone pain Dizziness Fatigue Headache Joint pain Muscle pain Stomach pain This list may not describe all possible side effects. Call your doctor for medical advice about side effects. You may report side effects to FDA at 1-800-FDA-1088. Where should I keep my medication? This medication is given in a hospital or clinic. It will not be stored at home. NOTE: This sheet is a summary. It may not cover all possible information. If you have questions about this medicine, talk to your doctor, pharmacist, or health care provider.  2023 Elsevier/Gold Standard (2020-09-08 00:00:00)  

## 2021-12-07 IMAGING — CT CT HEAD CODE STROKE
3 series · 15 of 47 positions shown, 18 images · non-contrast
Comparison: Prior CT from 06/28/2020.

CLINICAL DATA: Code stroke. Initial evaluation for acute stroke,
left facial droop.

EXAM:
CT HEAD WITHOUT CONTRAST
TECHNIQUE: Contiguous axial images were obtained from the base of the skull
through the vertex without intravenous contrast.

[Series 3: head 5.0 st · axial · 0.44mm/px · z∈[-161,-36]mm · 9 of 31 slices shown, 12 images]
[im 3/31  brain]
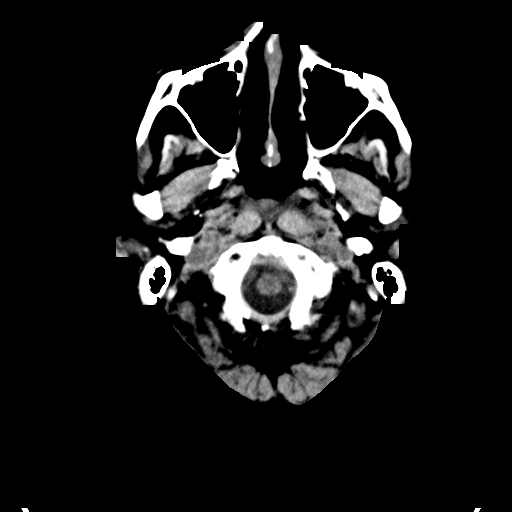
[im 3/31  bone]
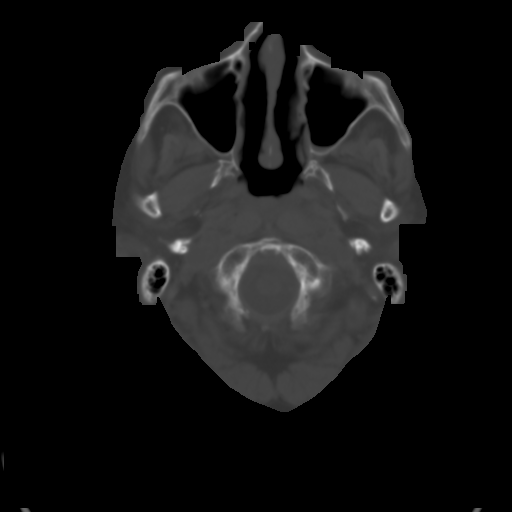
[im 6/31  brain]
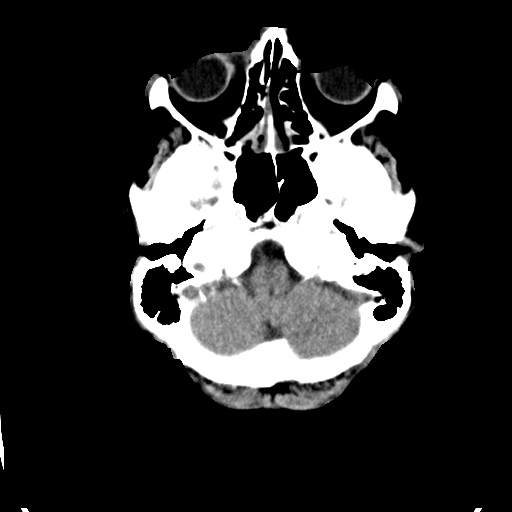
[im 9/31  brain]
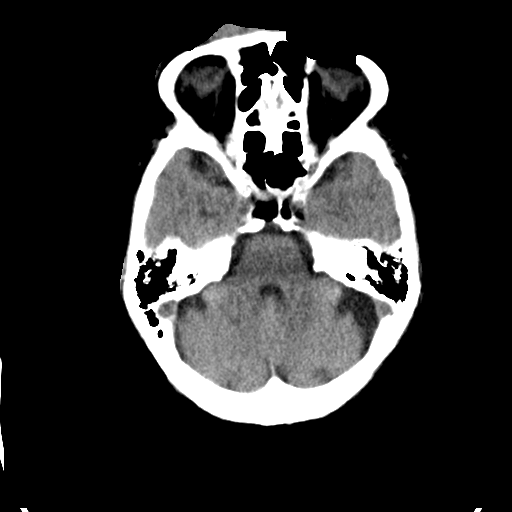
[im 12/31  brain]
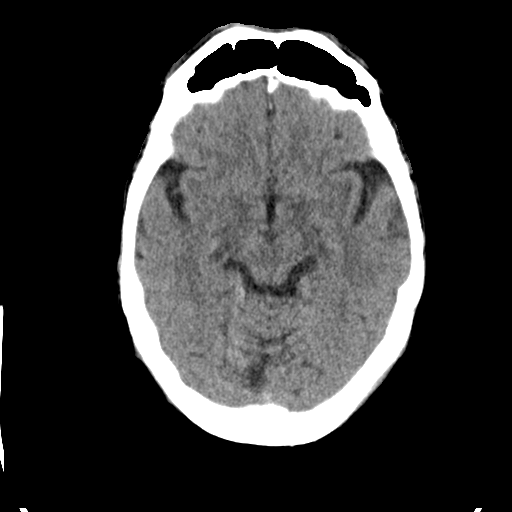
[im 16/31  brain]
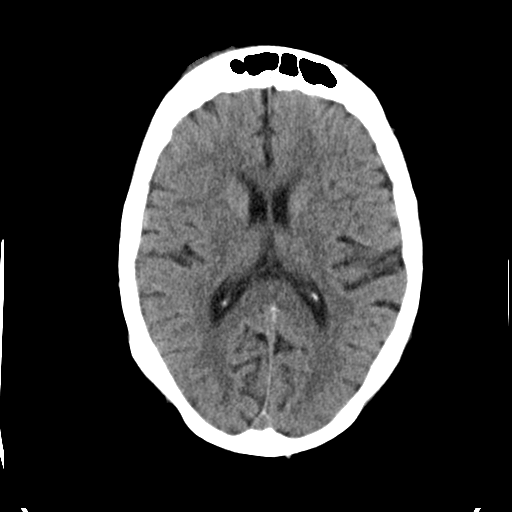
[im 16/31  bone]
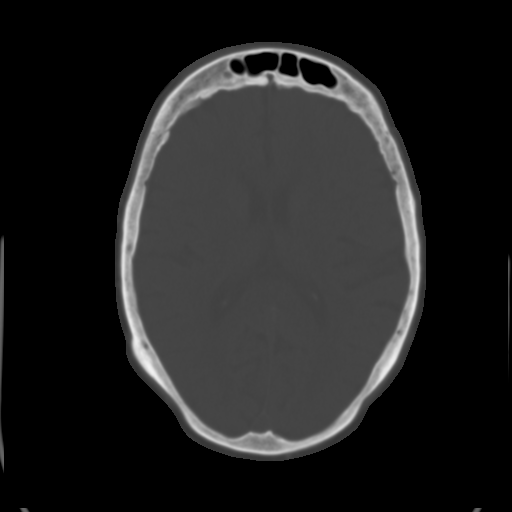
[im 19/31  brain]
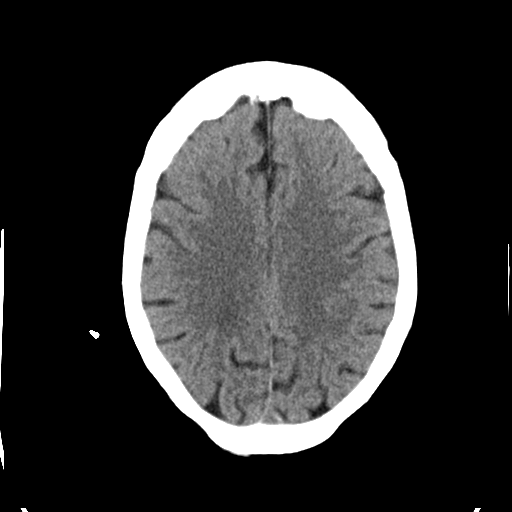
[im 22/31  brain]
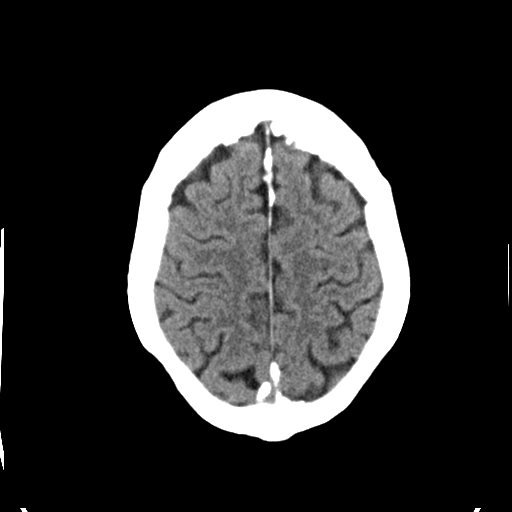
[im 25/31  brain]
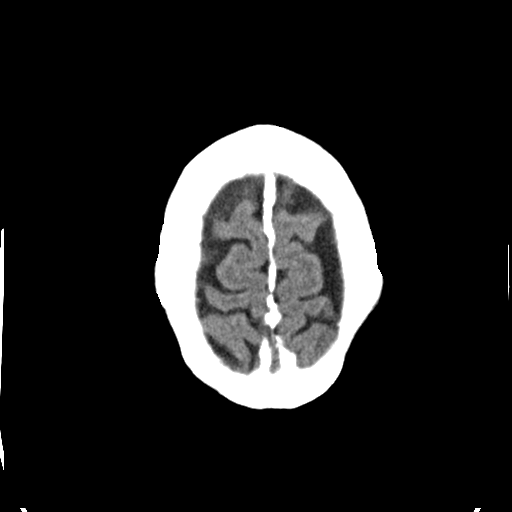
[im 28/31  brain]
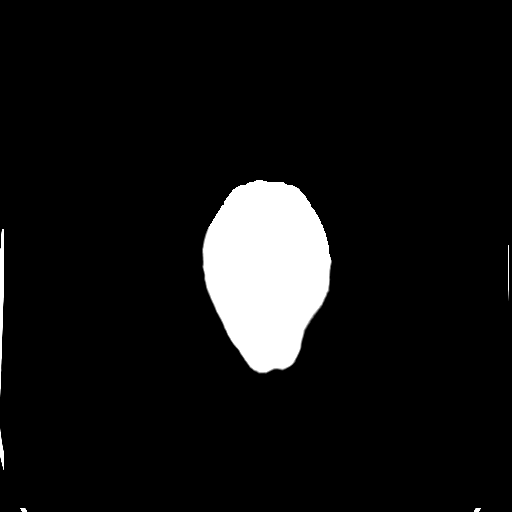
[im 28/31  bone]
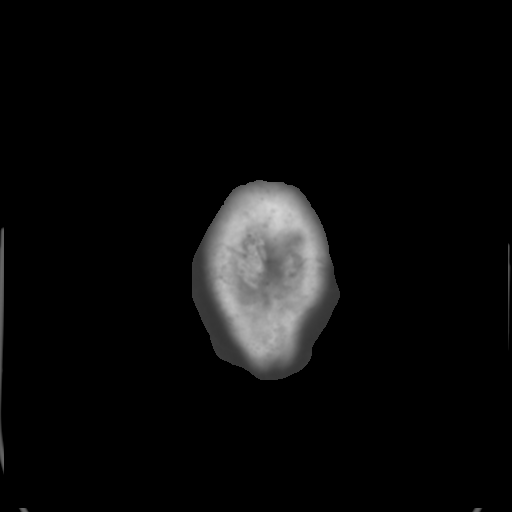

[Series 5: head 3.0 cor st · coronal · 0.30mm/px · 3 of 67 slices shown]
[im 23/67  brain]
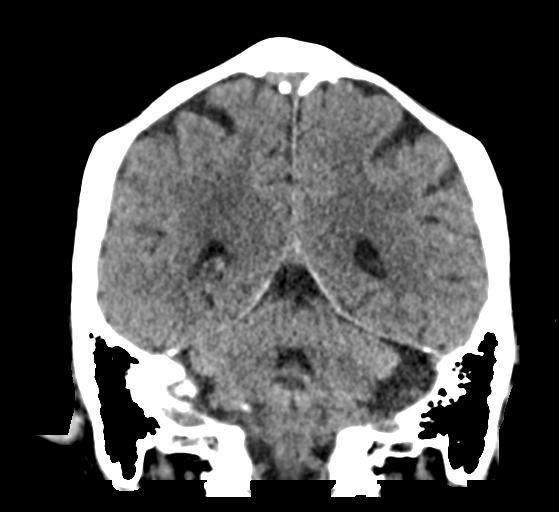
[im 30/67  brain]
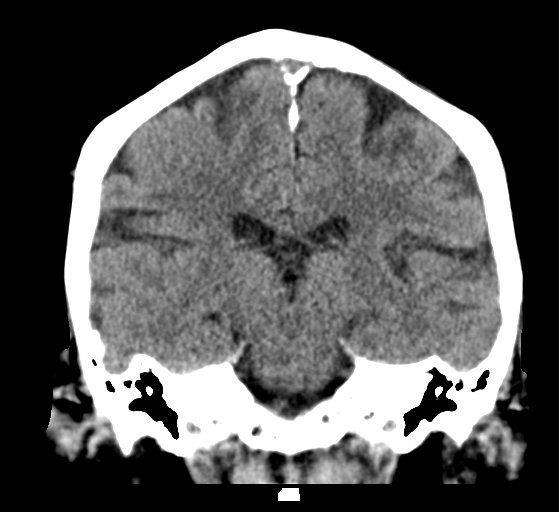
[im 37/67  brain]
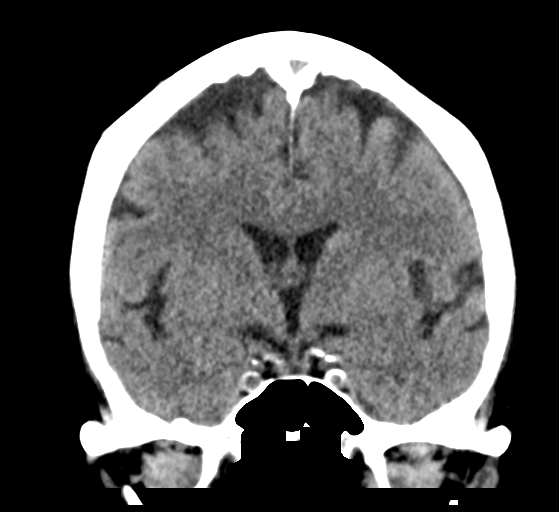

[Series 6: head 3.0 sag st · sagittal · 0.30mm/px · 3 of 53 slices shown]
[im 18/53  brain]
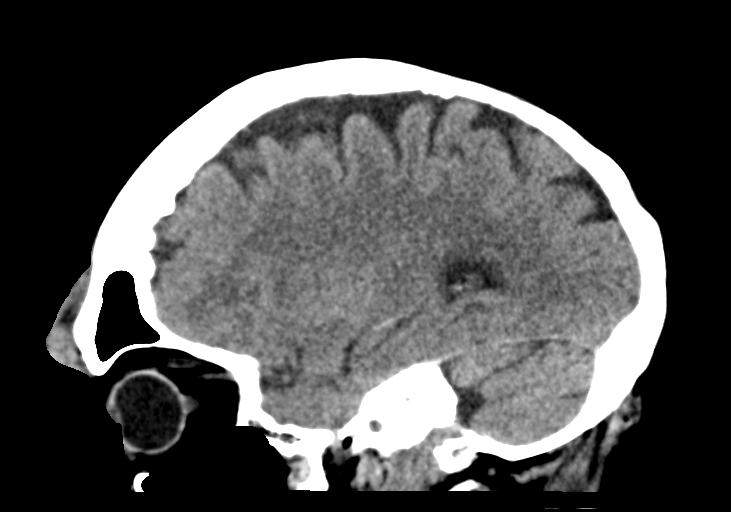
[im 27/53  brain]
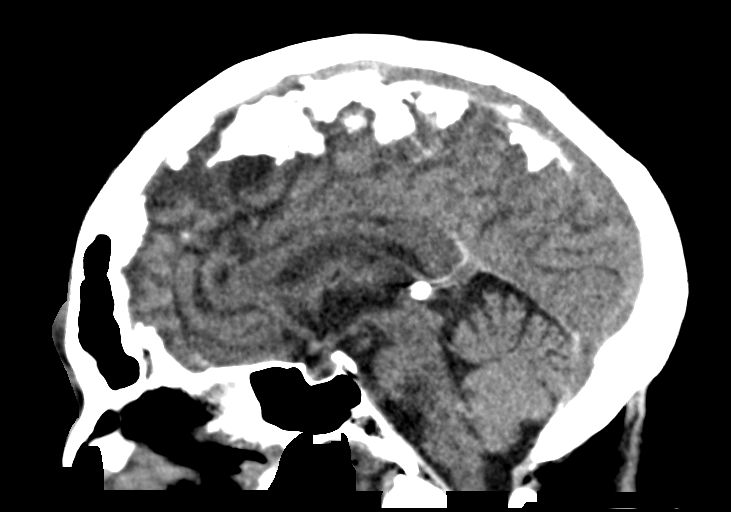
[im 35/53  brain]
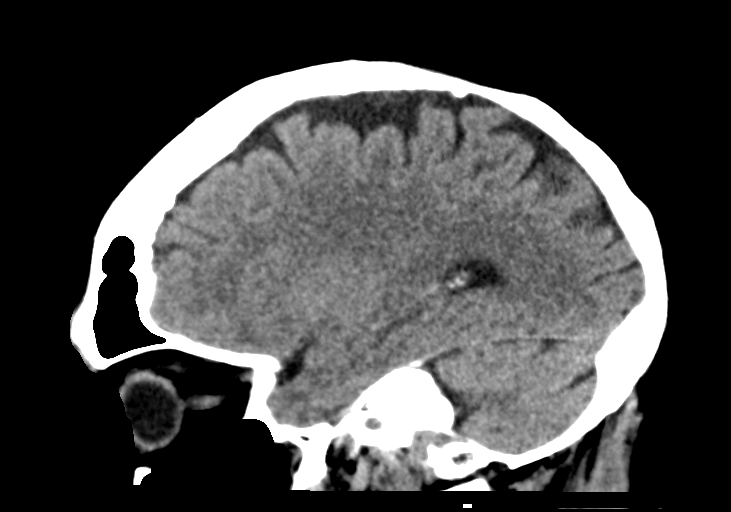

[15 of 47 positions shown; findings below may reference images not displayed]

FINDINGS: Brain: Generalized age appropriate cerebral atrophy with mild
chronic small vessel ischemic disease. No acute intracranial
hemorrhage. No acute large vessel territory infarct. No mass lesion,
midline shift or mass effect. No hydrocephalus. No visible
extra-axial fluid collection.

Vascular: No hyperdense vessel. Scattered vascular calcifications
noted within the carotid siphons.

Skull: Soft tissue contusion present at the right forehead and
periorbital region. No calvarial fracture.

Sinuses/Orbits: Globes and orbital soft tissues demonstrate no other
acute finding. Scattered mucosal thickening noted within the
ethmoidal air cells and left maxillary sinus. Paranasal sinuses are
otherwise clear. No mastoid effusion.

Other: None.

ASPECTS (Alberta Stroke Program Early CT Score)

- Ganglionic level infarction (caudate, lentiform nuclei, internal
capsule, insula, M1-M3 cortex): 7

- Supraganglionic infarction (M4-M6 cortex): 3

Total score (0-10 with 10 being normal): 10
IMPRESSION: 1. No acute intracranial abnormality.
2. ASPECTS is 10.
3. Right frontal/periorbital scalp contusion. No calvarial fracture.

These results were communicated to Dr. Cafi at [DATE] on 09/21/2020
by text page via the [employer] messaging system.

## 2021-12-08 IMAGING — DX DG ABD PORTABLE 1V
1 series · 1 of 1 positions shown · non-contrast
Comparison: None.

CLINICAL DATA: OG tube position.

EXAM:
PORTABLE ABDOMEN - 1 VIEW

[abdomen kub]
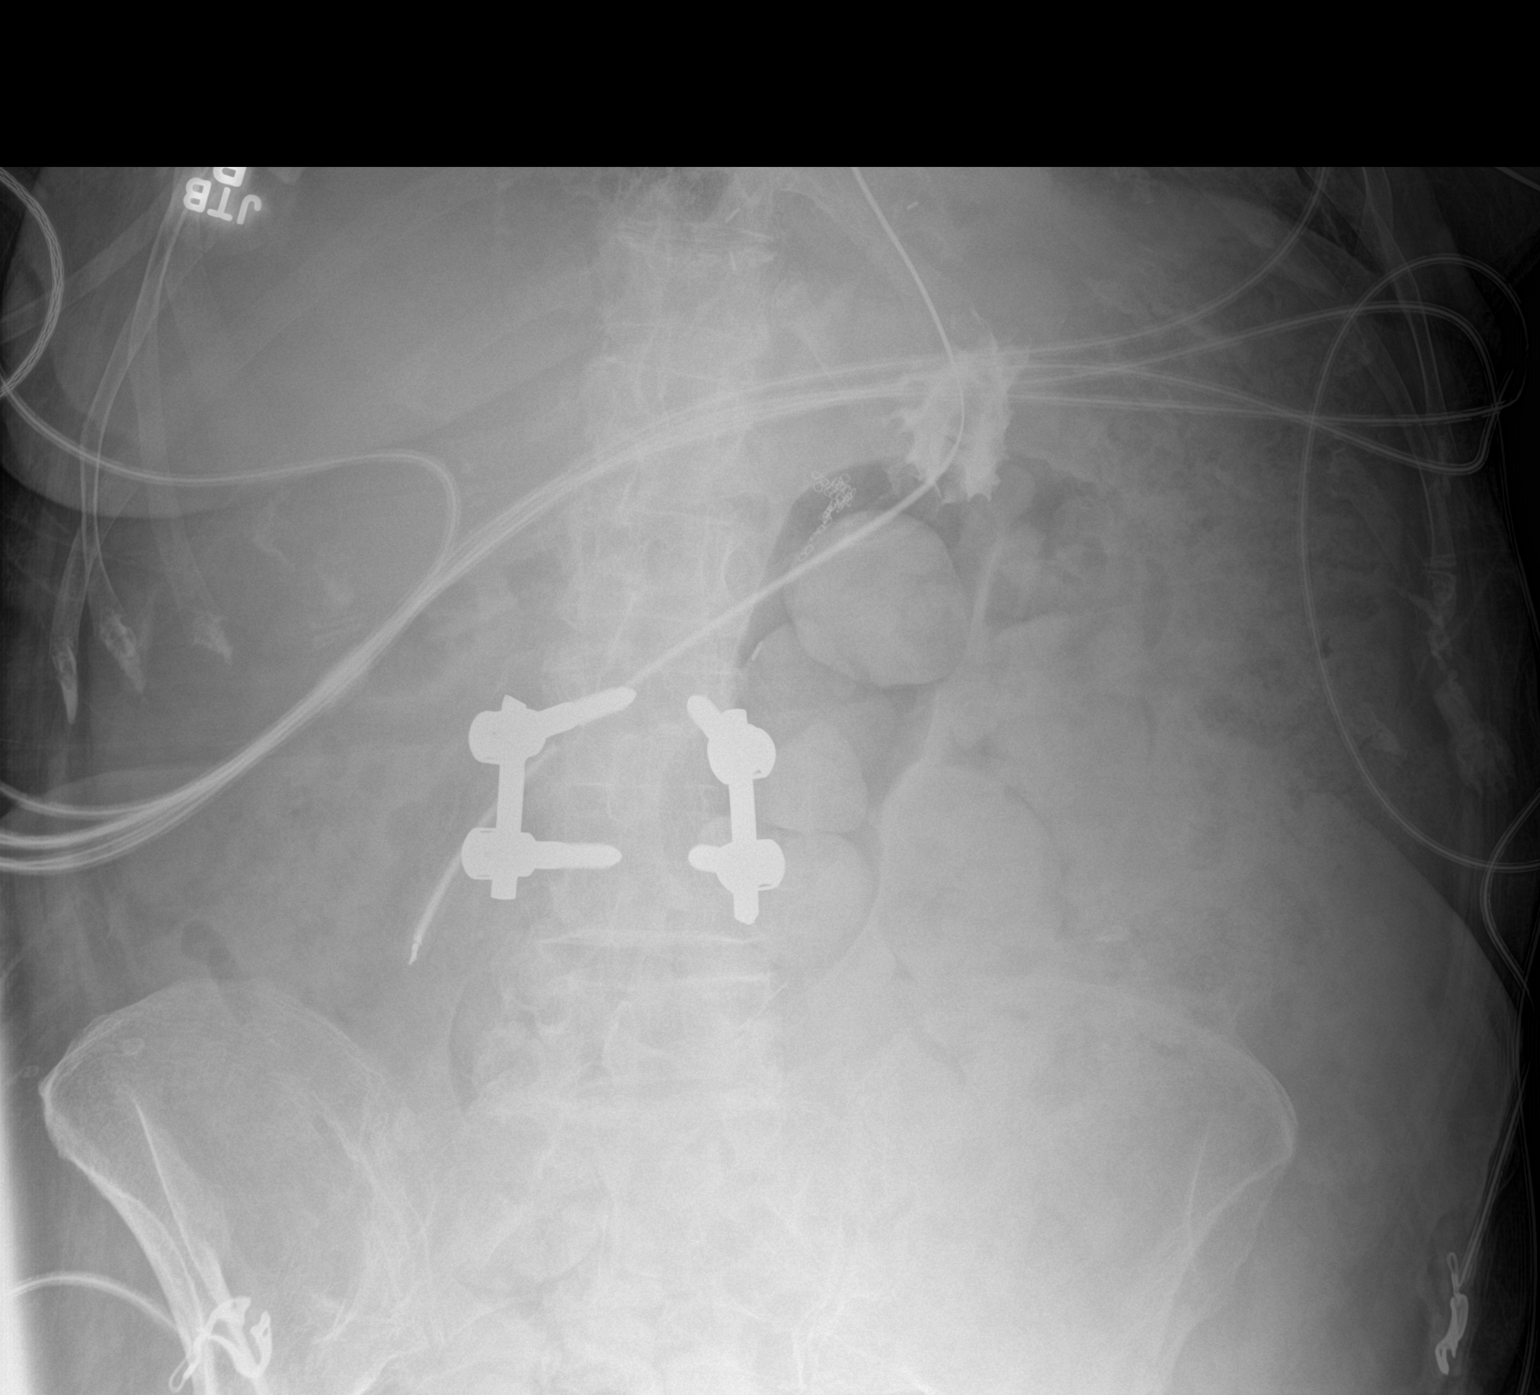

[1 of 1 positions shown; findings below may reference images not displayed]

FINDINGS: Gastric tube courses below the diaphragm and extends to the right of
midline, with the tip most likely in proximal duodenum or distal
stomach. A small amount of contrast noted within the stomach.
Nonobstructive bowel gas pattern with moderate to large colonic
stool burden. Please see same day chest radiograph for
characterization of the lung bases. Degenerative changes of the
spine. Lumbar fusion. Chain sutures in the left upper quadrant.
IMPRESSION: Gastric tube courses below the diaphragm, with the tip most likely
in proximal duodenum or distal stomach.

## 2021-12-10 IMAGING — DX DG CHEST 1V PORT
1 series · 1 of 1 positions shown · non-contrast
Comparison: 09/21/2020

CLINICAL DATA: Follow-up of pneumonia

EXAM:
PORTABLE CHEST 1 VIEW

[chest ap]
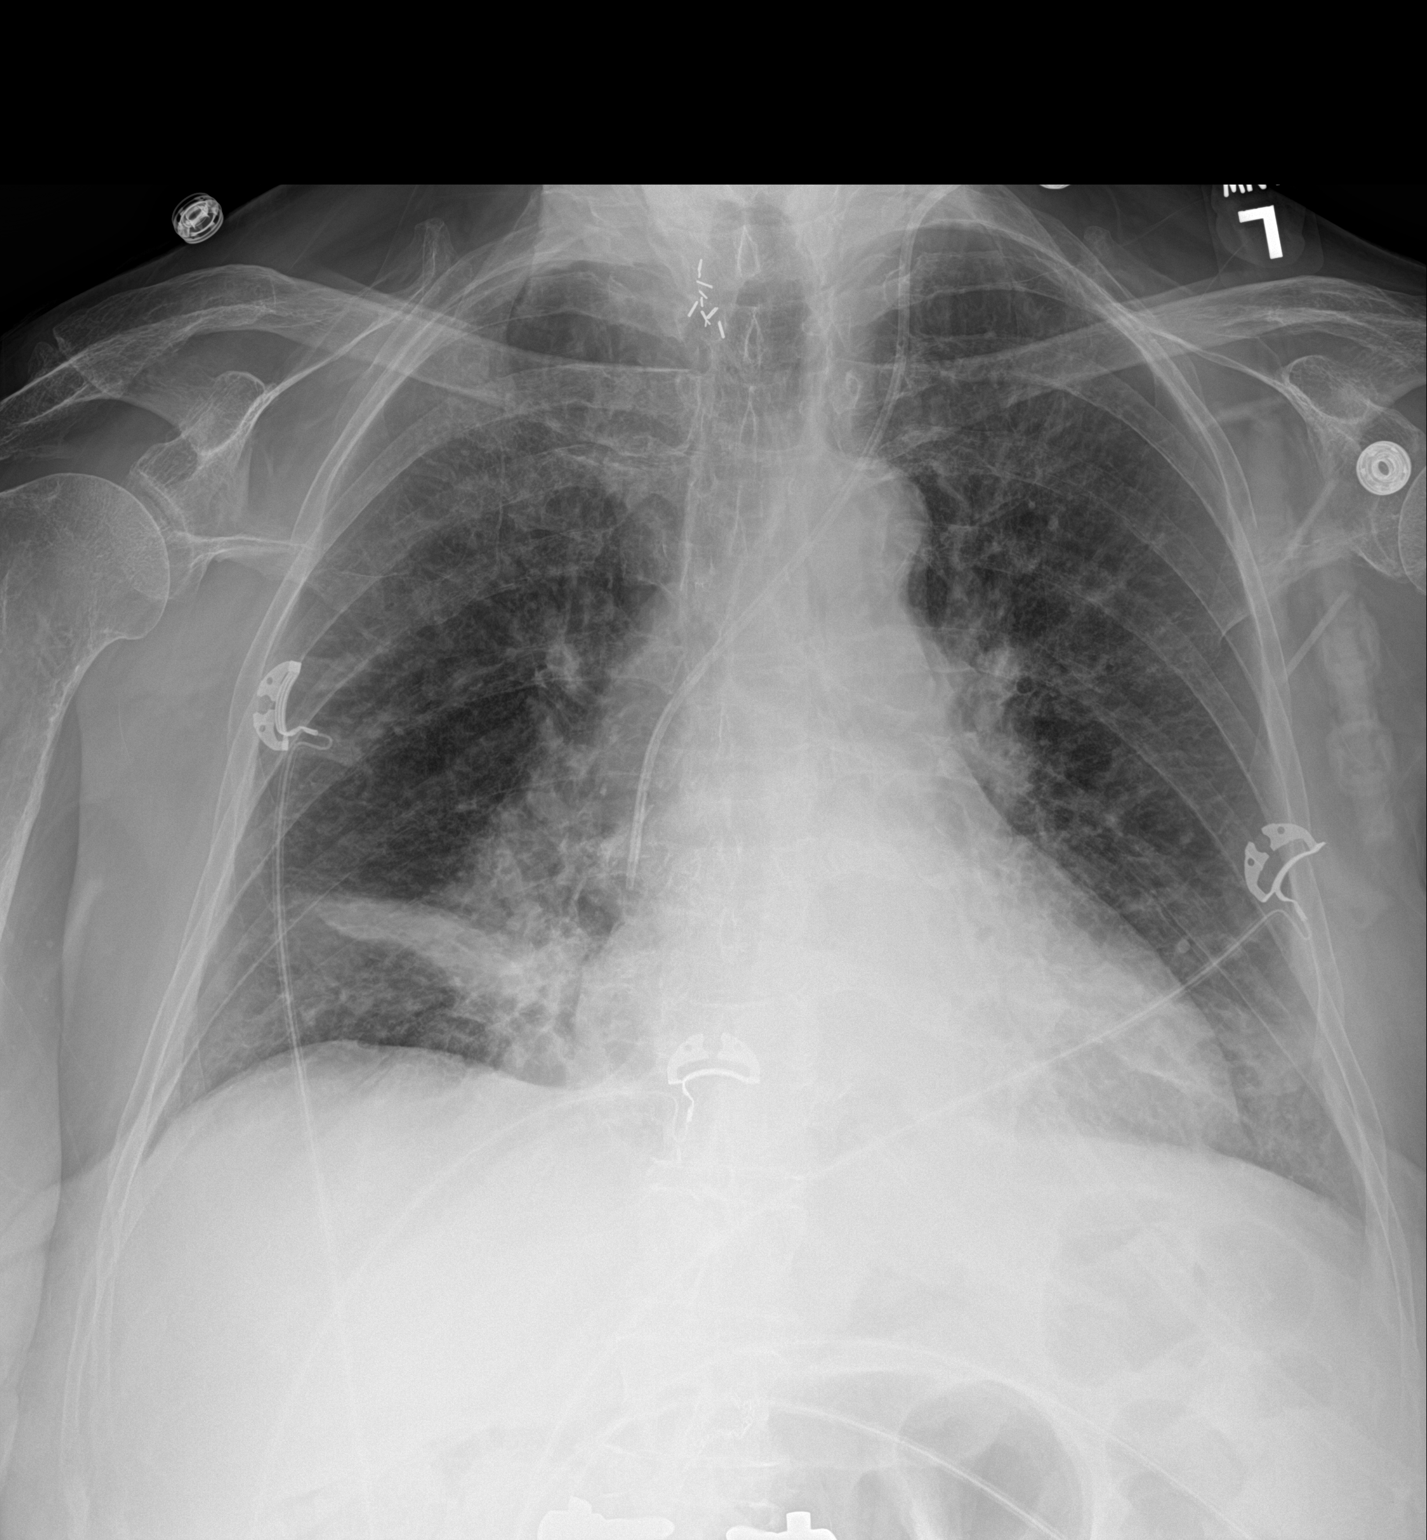

[1 of 1 positions shown; findings below may reference images not displayed]

FINDINGS: Left-sided central line terminates at the superior caval/atrial
junction. Midline trachea. Surgical clips in the right thoracic
inlet. Extubation and removal of nasogastric tube. Moderate
cardiomegaly. No pleural effusion or pneumothorax. Improved right
base aeration with decreased airspace disease along the right heart
border. Patchy left base airspace disease is not significantly
changed. Diffuse interstitial thickening is nonspecific.
IMPRESSION: Improved right lower lobe airspace disease, likely pneumonia.

Similar left base patchy opacities, atelectasis or infection.

## 2021-12-24 ENCOUNTER — Inpatient Hospital Stay: Payer: Medicare Other

## 2021-12-24 ENCOUNTER — Ambulatory Visit: Payer: Medicare Other

## 2021-12-24 DIAGNOSIS — D46B Refractory cytopenia with multilineage dysplasia and ring sideroblasts: Secondary | ICD-10-CM

## 2021-12-24 LAB — CBC: RBC: 2.65 — AB (ref 3.87–5.11)

## 2021-12-24 LAB — CBC AND DIFFERENTIAL
HCT: 27 — AB (ref 36–46)
Hemoglobin: 8.5 — AB (ref 12.0–16.0)
Neutrophils Absolute: 4.29
Platelets: 156 10*3/uL (ref 150–400)
WBC: 6.7

## 2021-12-24 MED FILL — Luspatercept-aamt For Subcutaneous Inj 75 MG: SUBCUTANEOUS | Qty: 1.5 | Status: AC

## 2021-12-25 ENCOUNTER — Inpatient Hospital Stay: Payer: Medicare Other

## 2021-12-25 VITALS — BP 137/52 | HR 62 | Temp 98.3°F | Resp 18 | Ht 62.0 in | Wt 149.2 lb

## 2021-12-25 DIAGNOSIS — D461 Refractory anemia with ring sideroblasts: Secondary | ICD-10-CM | POA: Diagnosis not present

## 2021-12-25 DIAGNOSIS — D46B Refractory cytopenia with multilineage dysplasia and ring sideroblasts: Secondary | ICD-10-CM

## 2021-12-25 MED ORDER — LUSPATERCEPT-AAMT 75 MG ~~LOC~~ SOLR
1.1500 mg/kg | Freq: Once | SUBCUTANEOUS | Status: AC
  Administered 2021-12-25: 75 mg via SUBCUTANEOUS
  Filled 2021-12-25: qty 1.5

## 2021-12-25 NOTE — Patient Instructions (Signed)
Luspatercept Injection What is this medication? LUSPATERCEPT (lus PAT er sept) treats low levels of red blood cells (anemia) in the body in people with beta thalassemia or myelodysplastic syndromes. It works by helping the body make more red blood cells. This medicine may be used for other purposes; ask your health care provider or pharmacist if you have questions. COMMON BRAND NAME(S): REBLOZYL What should I tell my care team before I take this medication? They need to know if you have any of these conditions: Have had your spleen removed High blood pressure History of blood clots Tobacco use An unusual or allergic reaction to luspatercept, other medications, foods, dyes or preservatives Pregnant or trying to get pregnant Breast-feeding How should I use this medication? This medication is for injection under the skin. It is given by your care team in a hospital or clinic setting. Talk to your care team about the use of the medication in children. This medication is not approved for use in children. Overdosage: If you think you have taken too much of this medicine contact a poison control center or emergency room at once. NOTE: This medicine is only for you. Do not share this medicine with others. What if I miss a dose? Keep appointments for follow-up doses. It is important not to miss your dose. Call your care team if you are unable to keep an appointment. What may interact with this medication? Interactions are not expected. This list may not describe all possible interactions. Give your health care provider a list of all the medicines, herbs, non-prescription drugs, or dietary supplements you use. Also tell them if you smoke, drink alcohol, or use illegal drugs. Some items may interact with your medicine. What should I watch for while using this medication? Your condition will be monitored carefully while you are receiving this medication. Talk to your care team if you wish to become  pregnant or think you might be pregnant. This medication can cause serious birth defects. Discuss contraceptive options with your care team. Do not breastfeed while taking this medication. You may need blood work done while you are taking this medication. What side effects may I notice from receiving this medication? Side effects that you should report to your care team as soon as possible: Allergic reactions--skin rash, itching, hives, swelling of the face, lips, tongue, or throat Blood clot--pain, swelling, or warmth in the leg, shortness of breath, chest pain Increase in blood pressure Severe back pain, numbness or weakness of the hands, arms, legs, or feet, loss of coordination, loss of bowel or bladder control Side effects that usually do not require medical attention (report these to your care team if they continue or are bothersome): Bone pain Dizziness Fatigue Headache Joint pain Muscle pain Stomach pain This list may not describe all possible side effects. Call your doctor for medical advice about side effects. You may report side effects to FDA at 1-800-FDA-1088. Where should I keep my medication? This medication is given in a hospital or clinic. It will not be stored at home. NOTE: This sheet is a summary. It may not cover all possible information. If you have questions about this medicine, talk to your doctor, pharmacist, or health care provider.  2023 Elsevier/Gold Standard (2020-09-08 00:00:00)  

## 2021-12-28 ENCOUNTER — Encounter: Payer: Self-pay | Admitting: Oncology

## 2022-01-12 ENCOUNTER — Other Ambulatory Visit: Payer: Self-pay

## 2022-01-13 NOTE — Progress Notes (Unsigned)
[facility]  [address] [place] 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  Over these past weeks, the patient has been receiving Retacrit with the hopes of this agent improving her anemia over time.  She comes in today to reassess her myelodysplasia.  Despite receiving her Retacrit injections every 2 weeks, the patient still complains of feeling very weak on a daily basis.    PHYSICAL EXAM:  There were no vitals taken for this visit. Wt Readings from Last 3 Encounters:  12/25/21 149 lb 4 oz (67.7 kg)  12/04/21 144 lb 12 oz (65.7 kg)  11/12/21 150 lb 1.3 oz (68.1 kg)   There is no height or weight on file to calculate BMI. Performance status (ECOG): 2 - Symptomatic, <50% confined to bed Physical Exam Constitutional:      Appearance: Normal appearance. She is not ill-appearing.  HENT:     Mouth/Throat:     Mouth: Mucous membranes are moist.     Pharynx: Oropharynx is clear. No oropharyngeal exudate or posterior oropharyngeal erythema.  Cardiovascular:     Rate and Rhythm: Normal rate and regular rhythm.     Heart sounds: No murmur heard.    No friction rub. No gallop.  Pulmonary:     Effort: Pulmonary effort is normal. No respiratory distress.     Breath sounds: Normal breath sounds. No wheezing, rhonchi or rales.  Abdominal:     General: Bowel sounds are normal. There is no distension.     Palpations: Abdomen is soft. There is no mass.     Tenderness: There is no abdominal tenderness.  Musculoskeletal:        General: No swelling.     Right lower leg: No edema.     Left lower leg: No edema.  Lymphadenopathy:     Cervical: No cervical adenopathy.     Upper Body:     Right upper body: No supraclavicular or axillary adenopathy.     Left upper body: No supraclavicular or  axillary adenopathy.     Lower Body: No right inguinal adenopathy. No left inguinal adenopathy.  Skin:    General: Skin is warm.     Coloration: Skin is not jaundiced.     Findings: No lesion or rash.  Neurological:     General: No focal deficit present.     Mental Status: She is alert and oriented to person, place, and time. Mental status is at baseline.  Psychiatric:        Mood and Affect: Mood normal.        Behavior: Behavior normal.        Thought Content: Thought content normal.   LABS:   PATHOLOGY:  Her bone marrow biopsy revealed the following: DIAGNOSIS:   BONE MARROW, ASPIRATE, CLOT, CORE:  -Variably cellular bone marrow with dyspoietic changes  -See comment   PERIPHERAL BLOOD:  -Normocytic-normochromic anemia  -Neutrophilic left shift   COMMENT:   The bone marrow shows dyspoietic changes involving myeloid cell lines  associated with abundant ring sideroblasts.  No increase in blastic  cells identified.  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  Correlation with  cytogenetic and FISH studies is recommended.   MICROSCOPIC DESCRIPTION:   PERIPHERAL BLOOD SMEAR: The red blood cells display moderate  anisocytosis with macrocytic and normocytic cells.  There is mild  poikilocytosis with elliptocytes, teardrop cells, and scattered  schistocytes.  There is mild polychromasia.  The white blood cells are  normal in number but with neutrophilic left shift.  Scattered mature  neutrophils are hypogranular.  The platelets are normal in number.   BONE MARROW ASPIRATE: Bone marrow particles present  Erythroid precursors: Left shifted maturation.  Maturing cells display  nuclear cytoplasmic dyssynchrony or irregular/lobulated nuclei  Granulocytic precursors: Progressive maturation with dysgranulopoiesis.  Mature neutrophilic cells display hypogranulation and/or hypolobation.  No increase in blastic cells identified.   Megakaryocytes: Increased in number including numerous abnormal forms.  The latter consist of small hypolobated/unilobated cells in addition to  large hyperchromatic forms  Lymphocytes/plasma cells: Large aggregates not present   TOUCH PREPARATIONS: Mixture of cell-types present   CLOT AND BIOPSY: The sections show cellularity ranging from 20 to 60%  and shows a mixture of myeloid cell types including increased number of  megakaryocytes with abnormal forms.  Small clusters of immature  mononuclear cells are seen correlating with increased early erythroid  precursors seen in the aspirate.  A small interstitial lymphoid  aggregate composed of small lymphoid cells is seen admixed with  histiocytes.   IRON STAIN: Iron stains are performed on a bone marrow aspirate or touch  imprint smear and section of clot. The controls stained appropriately.        Storage Iron: Increased       Ring Sideroblasts: Abundant (more than 15% of erythroid precursors)   ADDITIONAL DATA/TESTING: The specimen was sent for cytogenetic analysis  and FISH for MDS and a separate report will follow.  Flow cytometric  analysis ([facility] [phone]) failed to show any significant CD34 positive  blastic population.  Analysis of the lymphoid population shows  predominance of T cells with nonspecific changes.  No monoclonal B-cell  population identified.   CELL COUNT DATA:   Bone Marrow count performed on 500 cells shows:  Blasts:   0%   Myeloid:  57%  Promyelocytes: 0%   Erythroid:     30%  Myelocytes:    14%  Lymphocytes:   7%  Metamyelocytes:     4%   Plasma cells:  3%  Bands:    5%  Neutrophils:   28%  M:E ratio:     1.9  Eosinophils:   6%  Basophils:     0%  Monocytes:     3%   Lab Data: CBC performed on 08/15/2021 shows:  WBC: 4.0 k/uL  Neutrophils:   51%            Myelos:    3%  Hgb: 7.5 g/dL  Lymphocytes:   25%  Metas:      2%  HCT: 22.2 %    Monocytes:     12%  MCV: 97 fL     Eosinophils:   7%  RDW: 19.3  %    Basophils:     0%  PLT: 224 k/uL       ASSESSMENT & PLAN:  Assessment/Plan:  An 81 y.o. 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  Despite her Retacrit shots being given biweekly, her hemoglobin remains suboptimal.  I will arrange for her to receive 2 units of blood this week.  As Luspatercept has now been approved in the front-line setting for treatment with myelodysplasia, this injection will now be used in place of Retacrit, with the first shot to be given on Monday, September 18th.  It will be given once every 3 weeks.  I will see her back in 9 weeks (after 3 Luspatercept injections) to reassess her myelodysplasia to see how well her hemoglobin has improved.  The patient understands all the plans discussed today and is in agreement with them.    Camillia Marcy Macarthur Critchley, MD

## 2022-01-14 ENCOUNTER — Telehealth: Payer: Self-pay | Admitting: Oncology

## 2022-01-14 ENCOUNTER — Inpatient Hospital Stay (INDEPENDENT_AMBULATORY_CARE_PROVIDER_SITE_OTHER): Payer: Medicare Other | Admitting: Oncology

## 2022-01-14 ENCOUNTER — Inpatient Hospital Stay: Payer: Medicare Other | Attending: Oncology

## 2022-01-14 ENCOUNTER — Other Ambulatory Visit: Payer: Self-pay | Admitting: Oncology

## 2022-01-14 ENCOUNTER — Ambulatory Visit: Payer: Medicare Other

## 2022-01-14 DIAGNOSIS — D46B Refractory cytopenia with multilineage dysplasia and ring sideroblasts: Secondary | ICD-10-CM | POA: Diagnosis not present

## 2022-01-14 DIAGNOSIS — D46Z Other myelodysplastic syndromes: Secondary | ICD-10-CM

## 2022-01-14 DIAGNOSIS — D461 Refractory anemia with ring sideroblasts: Secondary | ICD-10-CM | POA: Insufficient documentation

## 2022-01-14 NOTE — Telephone Encounter (Signed)
Patient has been scheduled for follow-up visit per 01/14/22 los. Pt given an appt calendar with date and time.  

## 2022-01-15 ENCOUNTER — Inpatient Hospital Stay: Payer: Medicare Other

## 2022-01-15 ENCOUNTER — Other Ambulatory Visit: Payer: Self-pay

## 2022-01-15 MED FILL — Luspatercept-aamt For Subcutaneous Inj 75 MG: SUBCUTANEOUS | Qty: 2 | Status: AC

## 2022-01-16 ENCOUNTER — Inpatient Hospital Stay: Payer: Medicare Other

## 2022-01-16 VITALS — BP 113/49 | HR 61 | Temp 98.4°F | Resp 18 | Ht 62.0 in | Wt 154.0 lb

## 2022-01-16 DIAGNOSIS — D46B Refractory cytopenia with multilineage dysplasia and ring sideroblasts: Secondary | ICD-10-CM

## 2022-01-16 DIAGNOSIS — D461 Refractory anemia with ring sideroblasts: Secondary | ICD-10-CM | POA: Diagnosis not present

## 2022-01-16 MED ORDER — LUSPATERCEPT-AAMT 75 MG ~~LOC~~ SOLR
1.4000 mg/kg | Freq: Once | SUBCUTANEOUS | Status: AC
  Administered 2022-01-16: 100 mg via SUBCUTANEOUS
  Filled 2022-01-16: qty 0.5

## 2022-01-16 NOTE — Patient Instructions (Signed)
Luspatercept Injection What is this medication? LUSPATERCEPT (lus PAT er sept) treats low levels of red blood cells (anemia) in the body in people with beta thalassemia or myelodysplastic syndromes. It works by helping the body make more red blood cells. This medicine may be used for other purposes; ask your health care provider or pharmacist if you have questions. COMMON BRAND NAME(S): REBLOZYL What should I tell my care team before I take this medication? They need to know if you have any of these conditions: Have had your spleen removed High blood pressure History of blood clots Tobacco use An unusual or allergic reaction to luspatercept, other medications, foods, dyes or preservatives Pregnant or trying to get pregnant Breast-feeding How should I use this medication? This medication is for injection under the skin. It is given by your care team in a hospital or clinic setting. Talk to your care team about the use of the medication in children. This medication is not approved for use in children. Overdosage: If you think you have taken too much of this medicine contact a poison control center or emergency room at once. NOTE: This medicine is only for you. Do not share this medicine with others. What if I miss a dose? Keep appointments for follow-up doses. It is important not to miss your dose. Call your care team if you are unable to keep an appointment. What may interact with this medication? Interactions are not expected. This list may not describe all possible interactions. Give your health care provider a list of all the medicines, herbs, non-prescription drugs, or dietary supplements you use. Also tell them if you smoke, drink alcohol, or use illegal drugs. Some items may interact with your medicine. What should I watch for while using this medication? Your condition will be monitored carefully while you are receiving this medication. Talk to your care team if you wish to become  pregnant or think you might be pregnant. This medication can cause serious birth defects. Discuss contraceptive options with your care team. Do not breastfeed while taking this medication. You may need blood work done while you are taking this medication. What side effects may I notice from receiving this medication? Side effects that you should report to your care team as soon as possible: Allergic reactions--skin rash, itching, hives, swelling of the face, lips, tongue, or throat Blood clot--pain, swelling, or warmth in the leg, shortness of breath, chest pain Increase in blood pressure Severe back pain, numbness or weakness of the hands, arms, legs, or feet, loss of coordination, loss of bowel or bladder control Side effects that usually do not require medical attention (report these to your care team if they continue or are bothersome): Bone pain Dizziness Fatigue Headache Joint pain Muscle pain Stomach pain This list may not describe all possible side effects. Call your doctor for medical advice about side effects. You may report side effects to FDA at 1-800-FDA-1088. Where should I keep my medication? This medication is given in a hospital or clinic. It will not be stored at home. NOTE: This sheet is a summary. It may not cover all possible information. If you have questions about this medicine, talk to your doctor, pharmacist, or health care provider.  2023 Elsevier/Gold Standard (2020-09-08 00:00:00)  

## 2022-01-21 ENCOUNTER — Encounter: Payer: Self-pay | Admitting: Oncology

## 2022-02-04 ENCOUNTER — Inpatient Hospital Stay: Payer: Medicare Other | Attending: Oncology

## 2022-02-04 DIAGNOSIS — D46B Refractory cytopenia with multilineage dysplasia and ring sideroblasts: Secondary | ICD-10-CM

## 2022-02-04 DIAGNOSIS — D461 Refractory anemia with ring sideroblasts: Secondary | ICD-10-CM | POA: Insufficient documentation

## 2022-02-04 LAB — CBC WITH DIFFERENTIAL (CANCER CENTER ONLY)
Abs Immature Granulocytes: 0.6 10*3/uL — ABNORMAL HIGH (ref 0.00–0.07)
Basophils Absolute: 0.2 10*3/uL — ABNORMAL HIGH (ref 0.0–0.1)
Basophils Relative: 2 %
Eosinophils Absolute: 0.2 10*3/uL (ref 0.0–0.5)
Eosinophils Relative: 2 %
HCT: 30 % — ABNORMAL LOW (ref 36.0–46.0)
Hemoglobin: 8.9 g/dL — ABNORMAL LOW (ref 12.0–15.0)
Lymphocytes Relative: 20 %
Lymphs Abs: 1.8 10*3/uL (ref 0.7–4.0)
MCH: 32.4 pg (ref 26.0–34.0)
MCHC: 29.7 g/dL — ABNORMAL LOW (ref 30.0–36.0)
MCV: 109.1 fL — ABNORMAL HIGH (ref 80.0–100.0)
Metamyelocytes Relative: 1 %
Monocytes Absolute: 1.2 10*3/uL — ABNORMAL HIGH (ref 0.1–1.0)
Monocytes Relative: 13 %
Myelocytes: 5 %
Neutro Abs: 5 10*3/uL (ref 1.7–7.7)
Neutrophils Relative %: 56 %
Platelet Count: 181 10*3/uL (ref 150–400)
Promyelocytes Relative: 1 %
RBC: 2.75 MIL/uL — ABNORMAL LOW (ref 3.87–5.11)
RDW: 24.4 % — ABNORMAL HIGH (ref 11.5–15.5)
WBC Count: 9 10*3/uL (ref 4.0–10.5)
nRBC: 0.8 % — ABNORMAL HIGH (ref 0.0–0.2)

## 2022-02-05 MED FILL — Luspatercept-aamt For Subcutaneous Inj 75 MG: SUBCUTANEOUS | Qty: 2 | Status: AC

## 2022-02-06 ENCOUNTER — Inpatient Hospital Stay: Payer: Medicare Other

## 2022-02-06 VITALS — BP 146/53 | HR 72 | Resp 20 | Ht 62.0 in | Wt 148.0 lb

## 2022-02-06 DIAGNOSIS — D461 Refractory anemia with ring sideroblasts: Secondary | ICD-10-CM | POA: Diagnosis not present

## 2022-02-06 DIAGNOSIS — D46B Refractory cytopenia with multilineage dysplasia and ring sideroblasts: Secondary | ICD-10-CM

## 2022-02-06 MED ORDER — LUSPATERCEPT-AAMT 75 MG ~~LOC~~ SOLR
1.4000 mg/kg | Freq: Once | SUBCUTANEOUS | Status: AC
  Administered 2022-02-06: 100 mg via SUBCUTANEOUS
  Filled 2022-02-06: qty 0.5

## 2022-02-06 NOTE — Patient Instructions (Signed)
Luspatercept Injection What is this medication? LUSPATERCEPT (lus PAT er sept) treats low levels of red blood cells (anemia) in the body in people with beta thalassemia or myelodysplastic syndromes. It works by helping the body make more red blood cells. This medicine may be used for other purposes; ask your health care provider or pharmacist if you have questions. COMMON BRAND NAME(S): REBLOZYL What should I tell my care team before I take this medication? They need to know if you have any of these conditions: Have had your spleen removed High blood pressure History of blood clots Tobacco use An unusual or allergic reaction to luspatercept, other medications, foods, dyes or preservatives Pregnant or trying to get pregnant Breast-feeding How should I use this medication? This medication is for injection under the skin. It is given by your care team in a hospital or clinic setting. Talk to your care team about the use of the medication in children. This medication is not approved for use in children. Overdosage: If you think you have taken too much of this medicine contact a poison control center or emergency room at once. NOTE: This medicine is only for you. Do not share this medicine with others. What if I miss a dose? Keep appointments for follow-up doses. It is important not to miss your dose. Call your care team if you are unable to keep an appointment. What may interact with this medication? Interactions are not expected. This list may not describe all possible interactions. Give your health care provider a list of all the medicines, herbs, non-prescription drugs, or dietary supplements you use. Also tell them if you smoke, drink alcohol, or use illegal drugs. Some items may interact with your medicine. What should I watch for while using this medication? Your condition will be monitored carefully while you are receiving this medication. Talk to your care team if you wish to become  pregnant or think you might be pregnant. This medication can cause serious birth defects. Discuss contraceptive options with your care team. Do not breastfeed while taking this medication. You may need blood work done while you are taking this medication. What side effects may I notice from receiving this medication? Side effects that you should report to your care team as soon as possible: Allergic reactions--skin rash, itching, hives, swelling of the face, lips, tongue, or throat Blood clot--pain, swelling, or warmth in the leg, shortness of breath, chest pain Increase in blood pressure Severe back pain, numbness or weakness of the hands, arms, legs, or feet, loss of coordination, loss of bowel or bladder control Side effects that usually do not require medical attention (report these to your care team if they continue or are bothersome): Bone pain Dizziness Fatigue Headache Joint pain Muscle pain Stomach pain This list may not describe all possible side effects. Call your doctor for medical advice about side effects. You may report side effects to FDA at 1-800-FDA-1088. Where should I keep my medication? This medication is given in a hospital or clinic. It will not be stored at home. NOTE: This sheet is a summary. It may not cover all possible information. If you have questions about this medicine, talk to your doctor, pharmacist, or health care provider.  2023 Elsevier/Gold Standard (2020-09-08 00:00:00)  

## 2022-02-26 ENCOUNTER — Inpatient Hospital Stay: Payer: Medicare Other | Attending: Oncology

## 2022-02-26 DIAGNOSIS — D461 Refractory anemia with ring sideroblasts: Secondary | ICD-10-CM | POA: Diagnosis present

## 2022-02-26 DIAGNOSIS — D46B Refractory cytopenia with multilineage dysplasia and ring sideroblasts: Secondary | ICD-10-CM

## 2022-02-26 LAB — CBC WITH DIFFERENTIAL (CANCER CENTER ONLY)
Abs Immature Granulocytes: 0.55 10*3/uL — ABNORMAL HIGH (ref 0.00–0.07)
Basophils Absolute: 0.1 10*3/uL (ref 0.0–0.1)
Basophils Relative: 1 %
Eosinophils Absolute: 0.4 10*3/uL (ref 0.0–0.5)
Eosinophils Relative: 5 %
HCT: 29.2 % — ABNORMAL LOW (ref 36.0–46.0)
Hemoglobin: 9 g/dL — ABNORMAL LOW (ref 12.0–15.0)
Immature Granulocytes: 8 %
Lymphocytes Relative: 16 %
Lymphs Abs: 1.1 10*3/uL (ref 0.7–4.0)
MCH: 33.3 pg (ref 26.0–34.0)
MCHC: 30.8 g/dL (ref 30.0–36.0)
MCV: 108.1 fL — ABNORMAL HIGH (ref 80.0–100.0)
Monocytes Absolute: 0.8 10*3/uL (ref 0.1–1.0)
Monocytes Relative: 10 %
Neutro Abs: 4.4 10*3/uL (ref 1.7–7.7)
Neutrophils Relative %: 60 %
Platelet Count: 191 10*3/uL (ref 150–400)
RBC: 2.7 MIL/uL — ABNORMAL LOW (ref 3.87–5.11)
RDW: 23.5 % — ABNORMAL HIGH (ref 11.5–15.5)
WBC Count: 7.2 10*3/uL (ref 4.0–10.5)
nRBC: 0.6 % — ABNORMAL HIGH (ref 0.0–0.2)

## 2022-02-27 MED FILL — Luspatercept-aamt For Subcutaneous Inj 75 MG: SUBCUTANEOUS | Qty: 2 | Status: AC

## 2022-02-28 ENCOUNTER — Inpatient Hospital Stay: Payer: Medicare Other

## 2022-02-28 VITALS — BP 142/52 | HR 67 | Temp 97.4°F | Resp 18 | Ht 62.0 in | Wt 147.5 lb

## 2022-02-28 DIAGNOSIS — D461 Refractory anemia with ring sideroblasts: Secondary | ICD-10-CM | POA: Diagnosis not present

## 2022-02-28 DIAGNOSIS — D46B Refractory cytopenia with multilineage dysplasia and ring sideroblasts: Secondary | ICD-10-CM

## 2022-02-28 MED ORDER — LUSPATERCEPT-AAMT 75 MG ~~LOC~~ SOLR
1.4000 mg/kg | Freq: Once | SUBCUTANEOUS | Status: AC
  Administered 2022-02-28: 100 mg via SUBCUTANEOUS
  Filled 2022-02-28: qty 1.5

## 2022-02-28 NOTE — Patient Instructions (Signed)
Luspatercept Injection What is this medication? LUSPATERCEPT (lus PAT er sept) treats low levels of red blood cells (anemia) in the body in people with beta thalassemia or myelodysplastic syndromes. It works by helping the body make more red blood cells. This medicine may be used for other purposes; ask your health care provider or pharmacist if you have questions. COMMON BRAND NAME(S): REBLOZYL What should I tell my care team before I take this medication? They need to know if you have any of these conditions: Have had your spleen removed High blood pressure History of blood clots Tobacco use An unusual or allergic reaction to luspatercept, other medications, foods, dyes or preservatives Pregnant or trying to get pregnant Breast-feeding How should I use this medication? This medication is for injection under the skin. It is given by your care team in a hospital or clinic setting. Talk to your care team about the use of the medication in children. This medication is not approved for use in children. Overdosage: If you think you have taken too much of this medicine contact a poison control center or emergency room at once. NOTE: This medicine is only for you. Do not share this medicine with others. What if I miss a dose? Keep appointments for follow-up doses. It is important not to miss your dose. Call your care team if you are unable to keep an appointment. What may interact with this medication? Interactions are not expected. This list may not describe all possible interactions. Give your health care provider a list of all the medicines, herbs, non-prescription drugs, or dietary supplements you use. Also tell them if you smoke, drink alcohol, or use illegal drugs. Some items may interact with your medicine. What should I watch for while using this medication? Your condition will be monitored carefully while you are receiving this medication. Talk to your care team if you wish to become  pregnant or think you might be pregnant. This medication can cause serious birth defects. Discuss contraceptive options with your care team. Do not breastfeed while taking this medication. You may need blood work done while you are taking this medication. What side effects may I notice from receiving this medication? Side effects that you should report to your care team as soon as possible: Allergic reactions--skin rash, itching, hives, swelling of the face, lips, tongue, or throat Blood clot--pain, swelling, or warmth in the leg, shortness of breath, chest pain Increase in blood pressure Severe back pain, numbness or weakness of the hands, arms, legs, or feet, loss of coordination, loss of bowel or bladder control Side effects that usually do not require medical attention (report these to your care team if they continue or are bothersome): Bone pain Dizziness Fatigue Headache Joint pain Muscle pain Stomach pain This list may not describe all possible side effects. Call your doctor for medical advice about side effects. You may report side effects to FDA at 1-800-FDA-1088. Where should I keep my medication? This medication is given in a hospital or clinic. It will not be stored at home. NOTE: This sheet is a summary. It may not cover all possible information. If you have questions about this medicine, talk to your doctor, pharmacist, or health care provider.  2023 Elsevier/Gold Standard (2020-09-08 00:00:00)  

## 2022-03-12 ENCOUNTER — Encounter: Payer: Self-pay | Admitting: Oncology

## 2022-03-15 ENCOUNTER — Other Ambulatory Visit: Payer: Self-pay | Admitting: Oncology

## 2022-03-15 DIAGNOSIS — D46B Refractory cytopenia with multilineage dysplasia and ring sideroblasts: Secondary | ICD-10-CM

## 2022-03-18 ENCOUNTER — Inpatient Hospital Stay: Payer: Medicare Other

## 2022-03-18 DIAGNOSIS — D461 Refractory anemia with ring sideroblasts: Secondary | ICD-10-CM | POA: Diagnosis not present

## 2022-03-18 DIAGNOSIS — D46B Refractory cytopenia with multilineage dysplasia and ring sideroblasts: Secondary | ICD-10-CM

## 2022-03-18 LAB — CBC WITH DIFFERENTIAL (CANCER CENTER ONLY)
Abs Immature Granulocytes: 0.4 10*3/uL — ABNORMAL HIGH (ref 0.00–0.07)
Basophils Absolute: 0.1 10*3/uL (ref 0.0–0.1)
Basophils Relative: 1 %
Eosinophils Absolute: 0.3 10*3/uL (ref 0.0–0.5)
Eosinophils Relative: 4 %
HCT: 30.5 % — ABNORMAL LOW (ref 36.0–46.0)
Hemoglobin: 9.3 g/dL — ABNORMAL LOW (ref 12.0–15.0)
Lymphocytes Relative: 34 %
Lymphs Abs: 2.5 10*3/uL (ref 0.7–4.0)
MCH: 32.6 pg (ref 26.0–34.0)
MCHC: 30.5 g/dL (ref 30.0–36.0)
MCV: 107 fL — ABNORMAL HIGH (ref 80.0–100.0)
Metamyelocytes Relative: 1 %
Monocytes Absolute: 0.4 10*3/uL (ref 0.1–1.0)
Monocytes Relative: 6 %
Myelocytes: 5 %
Neutro Abs: 3.6 10*3/uL (ref 1.7–7.7)
Neutrophils Relative %: 49 %
Platelet Count: 241 10*3/uL (ref 150–400)
RBC: 2.85 MIL/uL — ABNORMAL LOW (ref 3.87–5.11)
RDW: 23.3 % — ABNORMAL HIGH (ref 11.5–15.5)
WBC Count: 7.4 10*3/uL (ref 4.0–10.5)
nRBC: 0.8 % — ABNORMAL HIGH (ref 0.0–0.2)

## 2022-03-19 MED FILL — Luspatercept-aamt For Subcutaneous Inj 25 MG: SUBCUTANEOUS | Qty: 2 | Status: AC

## 2022-03-20 ENCOUNTER — Inpatient Hospital Stay: Payer: Medicare Other

## 2022-03-20 VITALS — BP 126/44 | HR 65 | Temp 98.3°F | Resp 18 | Ht 62.0 in | Wt 144.8 lb

## 2022-03-20 DIAGNOSIS — D461 Refractory anemia with ring sideroblasts: Secondary | ICD-10-CM | POA: Diagnosis not present

## 2022-03-20 DIAGNOSIS — D46B Refractory cytopenia with multilineage dysplasia and ring sideroblasts: Secondary | ICD-10-CM

## 2022-03-20 MED ORDER — LUSPATERCEPT-AAMT 25 MG ~~LOC~~ SOLR
1.4000 mg/kg | Freq: Once | SUBCUTANEOUS | Status: AC
  Administered 2022-03-20: 100 mg via SUBCUTANEOUS
  Filled 2022-03-20: qty 1.5

## 2022-03-20 NOTE — Patient Instructions (Signed)
Luspatercept Injection What is this medication? LUSPATERCEPT (lus PAT er sept) treats low levels of red blood cells (anemia) in the body in people with beta thalassemia or myelodysplastic syndromes. It works by helping the body make more red blood cells. This medicine may be used for other purposes; ask your health care provider or pharmacist if you have questions. COMMON BRAND NAME(S): REBLOZYL What should I tell my care team before I take this medication? They need to know if you have any of these conditions: Have had your spleen removed High blood pressure History of blood clots Tobacco use An unusual or allergic reaction to luspatercept, other medications, foods, dyes or preservatives Pregnant or trying to get pregnant Breast-feeding How should I use this medication? This medication is for injection under the skin. It is given by your care team in a hospital or clinic setting. Talk to your care team about the use of the medication in children. This medication is not approved for use in children. Overdosage: If you think you have taken too much of this medicine contact a poison control center or emergency room at once. NOTE: This medicine is only for you. Do not share this medicine with others. What if I miss a dose? Keep appointments for follow-up doses. It is important not to miss your dose. Call your care team if you are unable to keep an appointment. What may interact with this medication? Interactions are not expected. This list may not describe all possible interactions. Give your health care provider a list of all the medicines, herbs, non-prescription drugs, or dietary supplements you use. Also tell them if you smoke, drink alcohol, or use illegal drugs. Some items may interact with your medicine. What should I watch for while using this medication? Your condition will be monitored carefully while you are receiving this medication. Talk to your care team if you wish to become  pregnant or think you might be pregnant. This medication can cause serious birth defects. Discuss contraceptive options with your care team. Do not breastfeed while taking this medication. You may need blood work done while you are taking this medication. What side effects may I notice from receiving this medication? Side effects that you should report to your care team as soon as possible: Allergic reactions--skin rash, itching, hives, swelling of the face, lips, tongue, or throat Blood clot--pain, swelling, or warmth in the leg, shortness of breath, chest pain Increase in blood pressure Severe back pain, numbness or weakness of the hands, arms, legs, or feet, loss of coordination, loss of bowel or bladder control Side effects that usually do not require medical attention (report these to your care team if they continue or are bothersome): Bone pain Dizziness Fatigue Headache Joint pain Muscle pain Stomach pain This list may not describe all possible side effects. Call your doctor for medical advice about side effects. You may report side effects to FDA at 1-800-FDA-1088. Where should I keep my medication? This medication is given in a hospital or clinic. It will not be stored at home. NOTE: This sheet is a summary. It may not cover all possible information. If you have questions about this medicine, talk to your doctor, pharmacist, or health care provider.  2023 Elsevier/Gold Standard (2020-09-08 00:00:00)  

## 2022-04-07 NOTE — Progress Notes (Incomplete)
[facility]  [address] [place],  [place]  [address] [phone]  Clinic Day:  11/05/2021  Referring physician: Waynard Edwards, MD   HISTORY OF PRESENT ILLNESS:  The patient is a 82 y.o. 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  Over these past weeks, the patient has been receiving Luspatercept every weeks with the hopes of this agent improving her anemia over time.  She comes in today to reassess her myelodysplasia.  Since her last visit, the patient has been doing better.  Although she still has weakness, it has not been to the point where she has felt a blood transfusion was necessary.  PHYSICAL EXAM:  There were no vitals taken for this visit. Wt Readings from Last 3 Encounters:  03/20/22 144 lb 12 oz (65.7 kg)  02/28/22 147 lb 8 oz (66.9 kg)  02/06/22 148 lb (67.1 kg)   There is no height or weight on file to calculate BMI. Performance status (ECOG): 2 - Symptomatic, <50% confined to bed Physical Exam Constitutional:      Appearance: Normal appearance. She is not ill-appearing.  HENT:     Mouth/Throat:     Mouth: Mucous membranes are moist.     Pharynx: Oropharynx is clear. No oropharyngeal exudate or posterior oropharyngeal erythema.  Cardiovascular:     Rate and Rhythm: Normal rate and regular rhythm.     Heart sounds: No murmur heard.    No friction rub. No gallop.  Pulmonary:     Effort: Pulmonary effort is normal. No respiratory distress.     Breath sounds: Normal breath sounds. No wheezing, rhonchi or rales.  Abdominal:     General: Bowel sounds are normal. There is no distension.     Palpations: Abdomen is soft. There is no mass.     Tenderness: There is no abdominal tenderness.  Musculoskeletal:        General: No swelling.     Right lower leg: No edema.     Left lower leg: No edema.  Lymphadenopathy:     Cervical: No cervical adenopathy.     Upper Body:     Right upper body: No supraclavicular or axillary  adenopathy.     Left upper body: No supraclavicular or axillary adenopathy.     Lower Body: No right inguinal adenopathy. No left inguinal adenopathy.  Skin:    General: Skin is warm.     Coloration: Skin is not jaundiced.     Findings: No lesion or rash.  Neurological:     General: No focal deficit present.     Mental Status: She is alert and oriented to person, place, and time. Mental status is at baseline.  Psychiatric:        Mood and Affect: Mood normal.        Behavior: Behavior normal.        Thought Content: Thought content normal.  LABS:   PATHOLOGY:  Her bone marrow biopsy revealed the following: DIAGNOSIS:   BONE MARROW, ASPIRATE, CLOT, CORE:  -Variably cellular bone marrow with dyspoietic changes  -See comment   PERIPHERAL BLOOD:  -Normocytic-normochromic anemia  -Neutrophilic left shift   COMMENT:   The bone marrow shows dyspoietic changes involving myeloid cell lines  associated with abundant ring sideroblasts.  No increase in blastic  cells identified.  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  Correlation with  cytogenetic and FISH studies is recommended.   MICROSCOPIC DESCRIPTION:   PERIPHERAL  BLOOD SMEAR: The red blood cells display moderate  anisocytosis with macrocytic and normocytic cells.  There is mild  poikilocytosis with elliptocytes, teardrop cells, and scattered  schistocytes.  There is mild polychromasia.  The white blood cells are  normal in number but with neutrophilic left shift.  Scattered mature  neutrophils are hypogranular.  The platelets are normal in number.   BONE MARROW ASPIRATE: Bone marrow particles present  Erythroid precursors: Left shifted maturation.  Maturing cells display  nuclear cytoplasmic dyssynchrony or irregular/lobulated nuclei  Granulocytic precursors: Progressive maturation with dysgranulopoiesis.  Mature neutrophilic cells display hypogranulation and/or  hypolobation.  No increase in blastic cells identified.  Megakaryocytes: Increased in number including numerous abnormal forms.  The latter consist of small hypolobated/unilobated cells in addition to  large hyperchromatic forms  Lymphocytes/plasma cells: Large aggregates not present   TOUCH PREPARATIONS: Mixture of cell-types present   CLOT AND BIOPSY: The sections show cellularity ranging from 20 to 60%  and shows a mixture of myeloid cell types including increased number of  megakaryocytes with abnormal forms.  Small clusters of immature  mononuclear cells are seen correlating with increased early erythroid  precursors seen in the aspirate.  A small interstitial lymphoid  aggregate composed of small lymphoid cells is seen admixed with  histiocytes.   IRON STAIN: Iron stains are performed on a bone marrow aspirate or touch  imprint smear and section of clot. The controls stained appropriately.        Storage Iron: Increased       Ring Sideroblasts: Abundant (more than 15% of erythroid precursors)   ADDITIONAL DATA/TESTING: The specimen was sent for cytogenetic analysis  and FISH for MDS and a separate report will follow.  Flow cytometric  analysis (WLS [phone]) failed to show any significant CD34 positive  blastic population.  Analysis of the lymphoid population shows  predominance of T cells with nonspecific changes.  No monoclonal B-cell  population identified.   CELL COUNT DATA:   Bone Marrow count performed on 500 cells shows:  Blasts:   0%   Myeloid:  57%  Promyelocytes: 0%   Erythroid:     30%  Myelocytes:    14%  Lymphocytes:   7%  Metamyelocytes:     4%   Plasma cells:  3%  Bands:    5%  Neutrophils:   28%  M:E ratio:     1.9  Eosinophils:   6%  Basophils:     0%  Monocytes:     3%   Lab Data: CBC performed on 08/15/2021 shows:  WBC: 4.0 k/uL  Neutrophils:   51%            Myelos:    3%  Hgb: 7.5 g/dL  Lymphocytes:   25%  Metas:      2%  HCT: 22.2 %     Monocytes:     12%  MCV: 97 fL     Eosinophils:   7%  RDW: 19.3 %    Basophils:     0%  PLT: 224 k/uL       ASSESSMENT & PLAN:  Assessment/Plan:  An 82 y.o. 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  Although not ideal, her hemoglobin has gotten better since Luspatercept was started.  She will continue to receive this agent every 3 weeks.  I will increase the dosage to 1.45 mg/kg to see if the higher amount will lead to a greater response.  Her CBC will continue to be followed  every 3 weeks to assess her response to Luspatercept.  I will see her back in 12 weeks for repeat clinical assessment.  The patient understands all the plans discussed today and is in agreement with them.    Kailyn Dubie Macarthur Critchley, MD

## 2022-04-08 ENCOUNTER — Inpatient Hospital Stay: Payer: Medicare Other

## 2022-04-08 ENCOUNTER — Inpatient Hospital Stay: Payer: Medicare Other | Admitting: Oncology

## 2022-04-10 ENCOUNTER — Inpatient Hospital Stay: Payer: Medicare Other

## 2022-04-10 ENCOUNTER — Encounter: Payer: Self-pay | Admitting: Oncology

## 2022-04-10 ENCOUNTER — Other Ambulatory Visit: Payer: Self-pay

## 2022-04-14 ENCOUNTER — Other Ambulatory Visit: Payer: Self-pay | Admitting: Oncology

## 2022-04-14 DIAGNOSIS — D46B Refractory cytopenia with multilineage dysplasia and ring sideroblasts: Secondary | ICD-10-CM

## 2022-04-14 NOTE — Progress Notes (Unsigned)
[facility]  [address] [place],  [place]  [address] [phone]  Clinic Day:  11/05/2021  Referring physician: Cher Nakai, MD   HISTORY OF PRESENT ILLNESS:  The patient is a 82 y.o. 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  Over these past weeks, the patient has been receiving Luspatercept every weeks with the hopes of this agent improving her anemia over time.  She comes in today to reassess her myelodysplasia.  Since her last visit, the patient has been doing better.  Although she still has weakness, it has not been to the point where she has felt a blood transfusion was necessary.  PHYSICAL EXAM:  There were no vitals taken for this visit. Wt Readings from Last 3 Encounters:  03/20/22 144 lb 12 oz (65.7 kg)  02/28/22 147 lb 8 oz (66.9 kg)  02/06/22 148 lb (67.1 kg)   There is no height or weight on file to calculate BMI. Performance status (ECOG): 2 - Symptomatic, <50% confined to bed Physical Exam Constitutional:      Appearance: Normal appearance. She is not ill-appearing.  HENT:     Mouth/Throat:     Mouth: Mucous membranes are moist.     Pharynx: Oropharynx is clear. No oropharyngeal exudate or posterior oropharyngeal erythema.  Cardiovascular:     Rate and Rhythm: Normal rate and regular rhythm.     Heart sounds: No murmur heard.    No friction rub. No gallop.  Pulmonary:     Effort: Pulmonary effort is normal. No respiratory distress.     Breath sounds: Normal breath sounds. No wheezing, rhonchi or rales.  Abdominal:     General: Bowel sounds are normal. There is no distension.     Palpations: Abdomen is soft. There is no mass.     Tenderness: There is no abdominal tenderness.  Musculoskeletal:        General: No swelling.     Right lower leg: No edema.     Left lower leg: No edema.  Lymphadenopathy:     Cervical: No cervical adenopathy.     Upper Body:     Right upper body: No supraclavicular or axillary  adenopathy.     Left upper body: No supraclavicular or axillary adenopathy.     Lower Body: No right inguinal adenopathy. No left inguinal adenopathy.  Skin:    General: Skin is warm.     Coloration: Skin is not jaundiced.     Findings: No lesion or rash.  Neurological:     General: No focal deficit present.     Mental Status: She is alert and oriented to person, place, and time. Mental status is at baseline.  Psychiatric:        Mood and Affect: Mood normal.        Behavior: Behavior normal.        Thought Content: Thought content normal.   LABS:   PATHOLOGY:  Her bone marrow biopsy revealed the following: DIAGNOSIS:   BONE MARROW, ASPIRATE, CLOT, CORE:  -Variably cellular bone marrow with dyspoietic changes  -See comment   PERIPHERAL BLOOD:  -Normocytic-normochromic anemia  -Neutrophilic left shift   COMMENT:   The bone marrow shows dyspoietic changes involving myeloid cell lines  associated with abundant ring sideroblasts.  No increase in blastic  cells identified.  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  Correlation with  cytogenetic and FISH studies is recommended.   MICROSCOPIC DESCRIPTION:  PERIPHERAL BLOOD SMEAR: The red blood cells display moderate  anisocytosis with macrocytic and normocytic cells.  There is mild  poikilocytosis with elliptocytes, teardrop cells, and scattered  schistocytes.  There is mild polychromasia.  The white blood cells are  normal in number but with neutrophilic left shift.  Scattered mature  neutrophils are hypogranular.  The platelets are normal in number.   BONE MARROW ASPIRATE: Bone marrow particles present  Erythroid precursors: Left shifted maturation.  Maturing cells display  nuclear cytoplasmic dyssynchrony or irregular/lobulated nuclei  Granulocytic precursors: Progressive maturation with dysgranulopoiesis.  Mature neutrophilic cells display hypogranulation and/or  hypolobation.  No increase in blastic cells identified.  Megakaryocytes: Increased in number including numerous abnormal forms.  The latter consist of small hypolobated/unilobated cells in addition to  large hyperchromatic forms  Lymphocytes/plasma cells: Large aggregates not present   TOUCH PREPARATIONS: Mixture of cell-types present   CLOT AND BIOPSY: The sections show cellularity ranging from 20 to 60%  and shows a mixture of myeloid cell types including increased number of  megakaryocytes with abnormal forms.  Small clusters of immature  mononuclear cells are seen correlating with increased early erythroid  precursors seen in the aspirate.  A small interstitial lymphoid  aggregate composed of small lymphoid cells is seen admixed with  histiocytes.   IRON STAIN: Iron stains are performed on a bone marrow aspirate or touch  imprint smear and section of clot. The controls stained appropriately.        Storage Iron: Increased       Ring Sideroblasts: Abundant (more than 15% of erythroid precursors)   ADDITIONAL DATA/TESTING: The specimen was sent for cytogenetic analysis  and FISH for MDS and a separate report will follow.  Flow cytometric  analysis (WLS [id number]) failed to show any significant CD34 positive  blastic population.  Analysis of the lymphoid population shows  predominance of T cells with nonspecific changes.  No monoclonal B-cell  population identified.   CELL COUNT DATA:   Bone Marrow count performed on 500 cells shows:  Blasts:   0%   Myeloid:  57%  Promyelocytes: 0%   Erythroid:     30%  Myelocytes:    14%  Lymphocytes:   7%  Metamyelocytes:     4%   Plasma cells:  3%  Bands:    5%  Neutrophils:   28%  M:E ratio:     1.9  Eosinophils:   6%  Basophils:     0%  Monocytes:     3%   Lab Data: CBC performed on 08/15/2021 shows:  WBC: 4.0 k/uL  Neutrophils:   51%            Myelos:    3%  Hgb: 7.5 g/dL  Lymphocytes:   25%  Metas:      2%  HCT: 22.2 %     Monocytes:     12%  MCV: 97 fL     Eosinophils:   7%  RDW: 19.3 %    Basophils:     0%  PLT: 224 k/uL       ASSESSMENT & PLAN:  Assessment/Plan:  An 82 y.o. 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  Although not ideal, her hemoglobin has gotten better since Luspatercept was started.  She will continue to receive this agent every 3 weeks.  I will increase the dosage to 1.45 mg/kg to see if the higher amount will lead to a greater response.  Her CBC will continue to be  followed every 3 weeks to assess her response to Luspatercept.  I will see her back in 12 weeks for repeat clinical assessment.  The patient understands all the plans discussed today and is in agreement with them.    Lanson Randle Macarthur Critchley, MD

## 2022-04-15 ENCOUNTER — Inpatient Hospital Stay: Payer: Medicare Other

## 2022-04-15 ENCOUNTER — Inpatient Hospital Stay: Payer: Medicare Other | Attending: Oncology | Admitting: Oncology

## 2022-04-15 ENCOUNTER — Encounter: Payer: Self-pay | Admitting: Oncology

## 2022-04-15 DIAGNOSIS — D461 Refractory anemia with ring sideroblasts: Secondary | ICD-10-CM | POA: Insufficient documentation

## 2022-04-15 DIAGNOSIS — D46B Refractory cytopenia with multilineage dysplasia and ring sideroblasts: Secondary | ICD-10-CM | POA: Diagnosis not present

## 2022-04-15 DIAGNOSIS — Z79899 Other long term (current) drug therapy: Secondary | ICD-10-CM | POA: Insufficient documentation

## 2022-04-15 LAB — CBC AND DIFFERENTIAL
HCT: 23 — AB (ref 36–46)
Hemoglobin: 7.3 — AB (ref 12.0–16.0)
Neutrophils Absolute: 5.68
Platelets: 230 10*3/uL (ref 150–400)
WBC: 8

## 2022-04-15 LAB — CBC: RBC: 2.16 — AB (ref 3.87–5.11)

## 2022-04-16 ENCOUNTER — Other Ambulatory Visit: Payer: Self-pay

## 2022-04-16 MED FILL — Luspatercept-aamt For Subcutaneous Inj 25 MG: SUBCUTANEOUS | Qty: 2.3 | Status: AC

## 2022-04-17 ENCOUNTER — Other Ambulatory Visit: Payer: Self-pay | Admitting: Oncology

## 2022-04-17 ENCOUNTER — Inpatient Hospital Stay: Payer: Medicare Other

## 2022-04-17 ENCOUNTER — Telehealth: Payer: Self-pay

## 2022-04-17 VITALS — BP 140/50 | HR 87 | Temp 98.2°F | Resp 16 | Wt 145.1 lb

## 2022-04-17 DIAGNOSIS — D461 Refractory anemia with ring sideroblasts: Secondary | ICD-10-CM | POA: Diagnosis present

## 2022-04-17 DIAGNOSIS — Z79899 Other long term (current) drug therapy: Secondary | ICD-10-CM | POA: Diagnosis not present

## 2022-04-17 DIAGNOSIS — D46B Refractory cytopenia with multilineage dysplasia and ring sideroblasts: Secondary | ICD-10-CM

## 2022-04-17 LAB — PREPARE RBC (CROSSMATCH)

## 2022-04-17 MED ORDER — LUSPATERCEPT-AAMT 25 MG ~~LOC~~ SOLR
1.7500 mg/kg | Freq: Once | SUBCUTANEOUS | Status: AC
  Administered 2022-04-17: 115 mg via SUBCUTANEOUS
  Filled 2022-04-17: qty 1.5

## 2022-04-17 NOTE — Telephone Encounter (Signed)
Scheduled for lab appt for type and cross on [facility] this morning.  Will receive 1 unit of PRBC on Friday 04/19/2022 AT 1300, [facility] location.  Patient daughter aware.

## 2022-04-19 ENCOUNTER — Other Ambulatory Visit: Payer: Self-pay | Admitting: Pharmacist

## 2022-04-19 ENCOUNTER — Inpatient Hospital Stay: Payer: Medicare Other

## 2022-04-19 DIAGNOSIS — D46B Refractory cytopenia with multilineage dysplasia and ring sideroblasts: Secondary | ICD-10-CM

## 2022-04-19 DIAGNOSIS — D649 Anemia, unspecified: Secondary | ICD-10-CM

## 2022-04-19 DIAGNOSIS — D461 Refractory anemia with ring sideroblasts: Secondary | ICD-10-CM | POA: Diagnosis not present

## 2022-04-19 MED ORDER — DIPHENHYDRAMINE HCL 25 MG PO CAPS: 25.0000 mg | ORAL_CAPSULE | Freq: Once | ORAL | Status: DC

## 2022-04-19 MED ORDER — ACETAMINOPHEN 325 MG PO TABS: 650.0000 mg | ORAL_TABLET | Freq: Once | ORAL | Status: DC

## 2022-04-19 MED ORDER — SODIUM CHLORIDE 0.9% IV SOLUTION
250.0000 mL | Freq: Once | INTRAVENOUS | Status: AC
  Administered 2022-04-19: 250 mL via INTRAVENOUS

## 2022-04-19 NOTE — Patient Instructions (Signed)

## 2022-04-20 LAB — TYPE AND SCREEN
ABO/RH(D): O POS
Antibody Screen: NEGATIVE
Unit division: 0
Unit division: 0

## 2022-04-20 LAB — BPAM RBC
Blood Product Expiration Date: 202402272359
Blood Product Expiration Date: 202403182359
ISSUE DATE / TIME: 202402221019
ISSUE DATE / TIME: 202402231140
Unit Type and Rh: 5100
Unit Type and Rh: 5100

## 2022-04-26 ENCOUNTER — Encounter: Payer: Self-pay | Admitting: Oncology

## 2022-05-06 ENCOUNTER — Inpatient Hospital Stay: Payer: Medicare Other | Attending: Oncology

## 2022-05-06 DIAGNOSIS — D461 Refractory anemia with ring sideroblasts: Secondary | ICD-10-CM | POA: Insufficient documentation

## 2022-05-06 DIAGNOSIS — D46B Refractory cytopenia with multilineage dysplasia and ring sideroblasts: Secondary | ICD-10-CM

## 2022-05-06 LAB — CBC WITH DIFFERENTIAL (CANCER CENTER ONLY)
Abs Immature Granulocytes: 0.7 10*3/uL — ABNORMAL HIGH (ref 0.00–0.07)
Basophils Absolute: 0 10*3/uL (ref 0.0–0.1)
Basophils Relative: 0 %
Eosinophils Absolute: 0.2 10*3/uL (ref 0.0–0.5)
Eosinophils Relative: 4 %
HCT: 25.1 % — ABNORMAL LOW (ref 36.0–46.0)
Hemoglobin: 7.8 g/dL — ABNORMAL LOW (ref 12.0–15.0)
Lymphocytes Relative: 24 %
Lymphs Abs: 1.4 10*3/uL (ref 0.7–4.0)
MCH: 32.9 pg (ref 26.0–34.0)
MCHC: 31.1 g/dL (ref 30.0–36.0)
MCV: 105.9 fL — ABNORMAL HIGH (ref 80.0–100.0)
Metamyelocytes Relative: 2 %
Monocytes Absolute: 0.5 10*3/uL (ref 0.1–1.0)
Monocytes Relative: 8 %
Myelocytes: 9 %
Neutro Abs: 2.9 10*3/uL (ref 1.7–7.7)
Neutrophils Relative %: 51 %
Platelet Count: 257 10*3/uL (ref 150–400)
Promyelocytes Relative: 2 %
RBC: 2.37 MIL/uL — ABNORMAL LOW (ref 3.87–5.11)
RDW: 22.1 % — ABNORMAL HIGH (ref 11.5–15.5)
WBC Count: 5.7 10*3/uL (ref 4.0–10.5)
nRBC: 1.8 % — ABNORMAL HIGH (ref 0.0–0.2)

## 2022-05-07 MED FILL — Luspatercept-aamt For Subcutaneous Inj 75 MG: SUBCUTANEOUS | Qty: 2.3 | Status: AC

## 2022-05-08 ENCOUNTER — Inpatient Hospital Stay: Payer: Medicare Other

## 2022-05-08 ENCOUNTER — Encounter: Payer: Self-pay | Admitting: Oncology

## 2022-05-08 ENCOUNTER — Other Ambulatory Visit: Payer: Self-pay | Admitting: Pharmacist

## 2022-05-08 ENCOUNTER — Telehealth: Payer: Self-pay | Admitting: Pharmacist

## 2022-05-08 ENCOUNTER — Other Ambulatory Visit: Payer: Medicare Other

## 2022-05-08 VITALS — BP 131/56 | HR 73 | Temp 98.1°F | Resp 18 | Ht 62.0 in | Wt 145.0 lb

## 2022-05-08 DIAGNOSIS — D649 Anemia, unspecified: Secondary | ICD-10-CM

## 2022-05-08 DIAGNOSIS — D46B Refractory cytopenia with multilineage dysplasia and ring sideroblasts: Secondary | ICD-10-CM

## 2022-05-08 DIAGNOSIS — D461 Refractory anemia with ring sideroblasts: Secondary | ICD-10-CM | POA: Diagnosis not present

## 2022-05-08 MED ORDER — LUSPATERCEPT-AAMT 75 MG ~~LOC~~ SOLR
1.7500 mg/kg | Freq: Once | SUBCUTANEOUS | Status: AC
  Administered 2022-05-08: 115 mg via SUBCUTANEOUS
  Filled 2022-05-08: qty 1.5

## 2022-05-08 NOTE — Telephone Encounter (Signed)
error 

## 2022-05-08 NOTE — Patient Instructions (Signed)
Luspatercept Injection What is this medication? LUSPATERCEPT (lus PAT er sept) treats low levels of red blood cells (anemia) in the body in people with beta thalassemia or myelodysplastic syndromes. It works by helping the body make more red blood cells. This medicine may be used for other purposes; ask your health care provider or pharmacist if you have questions. COMMON BRAND NAME(S): REBLOZYL What should I tell my care team before I take this medication? They need to know if you have any of these conditions: Have had your spleen removed High blood pressure History of blood clots Tobacco use An unusual or allergic reaction to luspatercept, other medications, foods, dyes or preservatives Pregnant or trying to get pregnant Breast-feeding How should I use this medication? This medication is for injection under the skin. It is given by your care team in a hospital or clinic setting. Talk to your care team about the use of the medication in children. This medication is not approved for use in children. Overdosage: If you think you have taken too much of this medicine contact a poison control center or emergency room at once. NOTE: This medicine is only for you. Do not share this medicine with others. What if I miss a dose? Keep appointments for follow-up doses. It is important not to miss your dose. Call your care team if you are unable to keep an appointment. What may interact with this medication? Interactions are not expected. This list may not describe all possible interactions. Give your health care provider a list of all the medicines, herbs, non-prescription drugs, or dietary supplements you use. Also tell them if you smoke, drink alcohol, or use illegal drugs. Some items may interact with your medicine. What should I watch for while using this medication? Your condition will be monitored carefully while you are receiving this medication. Talk to your care team if you wish to become  pregnant or think you might be pregnant. This medication can cause serious birth defects. Discuss contraceptive options with your care team. Do not breastfeed while taking this medication. You may need blood work done while you are taking this medication. What side effects may I notice from receiving this medication? Side effects that you should report to your care team as soon as possible: Allergic reactions--skin rash, itching, hives, swelling of the face, lips, tongue, or throat Blood clot--pain, swelling, or warmth in the leg, shortness of breath, chest pain Increase in blood pressure Severe back pain, numbness or weakness of the hands, arms, legs, or feet, loss of coordination, loss of bowel or bladder control Side effects that usually do not require medical attention (report these to your care team if they continue or are bothersome): Bone pain Dizziness Fatigue Headache Joint pain Muscle pain Stomach pain This list may not describe all possible side effects. Call your doctor for medical advice about side effects. You may report side effects to FDA at 1-800-FDA-1088. Where should I keep my medication? This medication is given in a hospital or clinic. It will not be stored at home. NOTE: This sheet is a summary. It may not cover all possible information. If you have questions about this medicine, talk to your doctor, pharmacist, or health care provider.  2023 Elsevier/Gold Standard (2020-09-08 00:00:00)  

## 2022-05-09 ENCOUNTER — Other Ambulatory Visit: Payer: Medicare Other

## 2022-05-15 ENCOUNTER — Inpatient Hospital Stay: Payer: Medicare Other

## 2022-05-15 DIAGNOSIS — D461 Refractory anemia with ring sideroblasts: Secondary | ICD-10-CM | POA: Diagnosis not present

## 2022-05-15 DIAGNOSIS — D649 Anemia, unspecified: Secondary | ICD-10-CM

## 2022-05-15 LAB — CBC WITH DIFFERENTIAL (CANCER CENTER ONLY)
Abs Immature Granulocytes: 0.26 10*3/uL — ABNORMAL HIGH (ref 0.00–0.07)
Basophils Absolute: 0.1 10*3/uL (ref 0.0–0.1)
Basophils Relative: 1 %
Eosinophils Absolute: 0.1 10*3/uL (ref 0.0–0.5)
Eosinophils Relative: 1 %
HCT: 29.6 % — ABNORMAL LOW (ref 36.0–46.0)
Hemoglobin: 9.2 g/dL — ABNORMAL LOW (ref 12.0–15.0)
Immature Granulocytes: 5 %
Lymphocytes Relative: 23 %
Lymphs Abs: 1.3 10*3/uL (ref 0.7–4.0)
MCH: 33.3 pg (ref 26.0–34.0)
MCHC: 31.1 g/dL (ref 30.0–36.0)
MCV: 107.2 fL — ABNORMAL HIGH (ref 80.0–100.0)
Monocytes Absolute: 0.8 10*3/uL (ref 0.1–1.0)
Monocytes Relative: 14 %
Neutro Abs: 3.1 10*3/uL (ref 1.7–7.7)
Neutrophils Relative %: 56 %
Platelet Count: 146 10*3/uL — ABNORMAL LOW (ref 150–400)
RBC: 2.76 MIL/uL — ABNORMAL LOW (ref 3.87–5.11)
RDW: 23.5 % — ABNORMAL HIGH (ref 11.5–15.5)
WBC Count: 5.5 10*3/uL (ref 4.0–10.5)
nRBC: 0.7 % — ABNORMAL HIGH (ref 0.0–0.2)

## 2022-05-15 LAB — SAMPLE TO BLOOD BANK

## 2022-05-27 ENCOUNTER — Other Ambulatory Visit: Payer: Medicare Other

## 2022-05-28 ENCOUNTER — Other Ambulatory Visit: Payer: Self-pay

## 2022-05-29 ENCOUNTER — Ambulatory Visit: Payer: Medicare Other

## 2022-06-17 ENCOUNTER — Other Ambulatory Visit: Payer: Medicare Other

## 2022-06-18 ENCOUNTER — Encounter: Payer: Self-pay | Admitting: Oncology

## 2022-06-19 ENCOUNTER — Ambulatory Visit: Payer: Medicare Other

## 2022-06-26 DEATH — deceased

## 2022-07-08 ENCOUNTER — Other Ambulatory Visit: Payer: Medicare Other

## 2022-07-08 ENCOUNTER — Telehealth: Payer: Medicare Other | Admitting: Oncology

## 2022-07-10 ENCOUNTER — Ambulatory Visit: Payer: Medicare Other
